# Patient Record
Sex: Female | Born: 1985 | Race: White | Hispanic: Yes | Marital: Single | Smoking: Never smoker
Health system: Southern US, Community
[De-identification: ages and names within clinical notes are randomized; demographics above are authoritative.]

## PROBLEM LIST (undated history)

## (undated) ENCOUNTER — Ambulatory Visit (HOSPITAL_COMMUNITY): Disposition: A | Discharge status: Home / Self Care | Payer: 59

## (undated) DIAGNOSIS — N915 Oligomenorrhea, unspecified: Secondary | ICD-10-CM

## (undated) DIAGNOSIS — E282 Polycystic ovarian syndrome: Secondary | ICD-10-CM

## (undated) DIAGNOSIS — Z789 Other specified health status: Secondary | ICD-10-CM

## (undated) DIAGNOSIS — E119 Type 2 diabetes mellitus without complications: Secondary | ICD-10-CM

## (undated) DIAGNOSIS — N970 Female infertility associated with anovulation: Secondary | ICD-10-CM

## (undated) HISTORY — DX: Polycystic ovarian syndrome: E28.2

## (undated) HISTORY — PX: NO PAST SURGERIES: SHX2092

## (undated) HISTORY — DX: Female infertility associated with anovulation: N97.0

## (undated) HISTORY — DX: Oligomenorrhea, unspecified: N91.5

## (undated) HISTORY — DX: Type 2 diabetes mellitus without complications: E11.9

---

## 2005-02-07 ENCOUNTER — Ambulatory Visit: Payer: Self-pay | Admitting: Family Medicine

## 2005-02-28 ENCOUNTER — Ambulatory Visit: Payer: Self-pay | Admitting: Family Medicine

## 2005-03-03 ENCOUNTER — Ambulatory Visit: Payer: Self-pay | Admitting: *Deleted

## 2005-03-03 ENCOUNTER — Ambulatory Visit (HOSPITAL_COMMUNITY): Admission: RE | Admit: 2005-03-03 | Discharge: 2005-03-03 | Payer: Self-pay | Admitting: Family Medicine

## 2005-04-11 ENCOUNTER — Ambulatory Visit: Payer: Self-pay | Admitting: Family Medicine

## 2005-05-16 ENCOUNTER — Ambulatory Visit: Payer: Self-pay | Admitting: Family Medicine

## 2005-05-19 ENCOUNTER — Ambulatory Visit (HOSPITAL_COMMUNITY): Admission: RE | Admit: 2005-05-19 | Discharge: 2005-05-19 | Payer: Self-pay | Admitting: Family Medicine

## 2005-06-13 ENCOUNTER — Inpatient Hospital Stay (HOSPITAL_COMMUNITY): Admission: AD | Admit: 2005-06-13 | Discharge: 2005-06-13 | Payer: Self-pay | Admitting: Family Medicine

## 2005-06-27 ENCOUNTER — Ambulatory Visit: Payer: Self-pay | Admitting: Family Medicine

## 2005-06-29 ENCOUNTER — Inpatient Hospital Stay (HOSPITAL_COMMUNITY): Admission: AD | Admit: 2005-06-29 | Discharge: 2005-06-29 | Payer: Self-pay | Admitting: Obstetrics & Gynecology

## 2005-07-01 ENCOUNTER — Emergency Department (HOSPITAL_COMMUNITY): Admission: EM | Admit: 2005-07-01 | Discharge: 2005-07-01 | Payer: Self-pay | Admitting: Family Medicine

## 2005-07-06 ENCOUNTER — Ambulatory Visit: Payer: Self-pay | Admitting: *Deleted

## 2005-09-13 ENCOUNTER — Ambulatory Visit: Payer: Self-pay | Admitting: Obstetrics and Gynecology

## 2006-07-23 ENCOUNTER — Emergency Department (HOSPITAL_COMMUNITY): Admission: EM | Admit: 2006-07-23 | Discharge: 2006-07-23 | Payer: Self-pay | Admitting: Family Medicine

## 2006-08-15 ENCOUNTER — Encounter (INDEPENDENT_AMBULATORY_CARE_PROVIDER_SITE_OTHER): Payer: Self-pay | Admitting: Family Medicine

## 2006-08-15 ENCOUNTER — Ambulatory Visit: Payer: Self-pay | Admitting: Sports Medicine

## 2006-08-15 DIAGNOSIS — E669 Obesity, unspecified: Secondary | ICD-10-CM | POA: Insufficient documentation

## 2006-08-15 LAB — CONVERTED CEMR LAB
CO2: 25 meq/L (ref 19–32)
Calcium: 9.3 mg/dL (ref 8.4–10.5)
Cholesterol: 125 mg/dL (ref 0–200)
Creatinine, Ser: 0.63 mg/dL (ref 0.40–1.20)
HDL: 35 mg/dL — ABNORMAL LOW (ref >39)
Pap Smear: NORMAL
Sodium: 141 meq/L (ref 135–145)
Total CHOL/HDL Ratio: 3.6

## 2006-08-21 ENCOUNTER — Encounter (INDEPENDENT_AMBULATORY_CARE_PROVIDER_SITE_OTHER): Payer: Self-pay | Admitting: Family Medicine

## 2006-09-13 ENCOUNTER — Ambulatory Visit: Payer: Self-pay | Admitting: Sports Medicine

## 2006-10-17 ENCOUNTER — Ambulatory Visit: Payer: Self-pay | Admitting: Family Medicine

## 2007-02-20 ENCOUNTER — Ambulatory Visit: Payer: Self-pay | Admitting: Internal Medicine

## 2007-02-26 ENCOUNTER — Encounter (INDEPENDENT_AMBULATORY_CARE_PROVIDER_SITE_OTHER): Payer: Self-pay | Admitting: Family Medicine

## 2007-02-26 ENCOUNTER — Ambulatory Visit: Payer: Self-pay | Admitting: Family Medicine

## 2007-02-26 DIAGNOSIS — IMO0002 Reserved for concepts with insufficient information to code with codable children: Secondary | ICD-10-CM | POA: Insufficient documentation

## 2007-02-26 LAB — CONVERTED CEMR LAB
Pap Smear: NORMAL
Whiff Test: NEGATIVE

## 2007-03-04 ENCOUNTER — Encounter (INDEPENDENT_AMBULATORY_CARE_PROVIDER_SITE_OTHER): Payer: Self-pay | Admitting: Family Medicine

## 2007-03-11 ENCOUNTER — Ambulatory Visit: Payer: Self-pay | Admitting: Family Medicine

## 2008-06-26 ENCOUNTER — Encounter (INDEPENDENT_AMBULATORY_CARE_PROVIDER_SITE_OTHER): Payer: Self-pay | Admitting: Family Medicine

## 2008-06-26 ENCOUNTER — Ambulatory Visit: Payer: Self-pay | Admitting: Family Medicine

## 2008-06-26 LAB — CONVERTED CEMR LAB
Beta hcg, urine, semiquantitative: NEGATIVE
Hgb A1c MFr Bld: 5.7 %
Whiff Test: NEGATIVE

## 2008-06-29 LAB — CONVERTED CEMR LAB
ALT: 17 units/L (ref 0–35)
AST: 16 units/L (ref 0–37)
Albumin: 4.6 g/dL (ref 3.5–5.2)
Alkaline Phosphatase: 109 units/L (ref 39–117)
BUN: 12 mg/dL (ref 6–23)
CO2: 26 meq/L (ref 19–32)
Calcium: 9.5 mg/dL (ref 8.4–10.5)
Chlamydia, DNA Probe: NEGATIVE
Chloride: 102 meq/L (ref 96–112)
Cholesterol: 124 mg/dL (ref 0–200)
Creatinine, Ser: 0.71 mg/dL (ref 0.40–1.20)
GC Probe Amp, Genital: NEGATIVE
Glucose, Bld: 90 mg/dL (ref 70–99)
HDL: 42 mg/dL (ref >39)
LDL Cholesterol: 64 mg/dL (ref 0–99)
Potassium: 4.3 meq/L (ref 3.5–5.3)
Sodium: 139 meq/L (ref 135–145)
TSH: 1.06 microintl units/mL (ref 0.350–4.500)
Total Bilirubin: 0.3 mg/dL (ref 0.3–1.2)
Total CHOL/HDL Ratio: 3
Total Protein: 8.2 g/dL (ref 6.0–8.3)
Triglycerides: 90 mg/dL (ref <150)
VLDL: 18 mg/dL (ref 0–40)

## 2008-08-03 ENCOUNTER — Ambulatory Visit: Payer: Self-pay | Admitting: Family Medicine

## 2008-08-03 DIAGNOSIS — L83 Acanthosis nigricans: Secondary | ICD-10-CM | POA: Insufficient documentation

## 2009-03-10 ENCOUNTER — Ambulatory Visit: Payer: Self-pay | Admitting: Family Medicine

## 2009-03-10 DIAGNOSIS — R6882 Decreased libido: Secondary | ICD-10-CM | POA: Insufficient documentation

## 2009-03-10 LAB — CONVERTED CEMR LAB: Beta hcg, urine, semiquantitative: NEGATIVE

## 2009-03-15 ENCOUNTER — Emergency Department (HOSPITAL_COMMUNITY): Admission: EM | Admit: 2009-03-15 | Discharge: 2009-03-15 | Payer: Self-pay | Admitting: Family Medicine

## 2009-03-16 ENCOUNTER — Ambulatory Visit: Payer: Self-pay | Admitting: Family Medicine

## 2009-03-16 DIAGNOSIS — R109 Unspecified abdominal pain: Secondary | ICD-10-CM | POA: Insufficient documentation

## 2009-03-19 ENCOUNTER — Ambulatory Visit (HOSPITAL_COMMUNITY): Admission: RE | Admit: 2009-03-19 | Discharge: 2009-03-19 | Payer: Self-pay | Admitting: Family Medicine

## 2009-03-24 ENCOUNTER — Telehealth: Payer: Self-pay | Admitting: Family Medicine

## 2009-03-25 ENCOUNTER — Ambulatory Visit: Payer: Self-pay | Admitting: Family Medicine

## 2009-03-25 DIAGNOSIS — R109 Unspecified abdominal pain: Secondary | ICD-10-CM | POA: Insufficient documentation

## 2009-03-25 LAB — CONVERTED CEMR LAB: Beta hcg, urine, semiquantitative: NEGATIVE

## 2009-03-31 ENCOUNTER — Ambulatory Visit (HOSPITAL_COMMUNITY): Admission: RE | Admit: 2009-03-31 | Discharge: 2009-03-31 | Payer: Self-pay | Admitting: Family Medicine

## 2009-04-01 ENCOUNTER — Telehealth: Payer: Self-pay | Admitting: *Deleted

## 2009-04-01 ENCOUNTER — Telehealth: Payer: Self-pay | Admitting: Family Medicine

## 2009-04-01 ENCOUNTER — Encounter: Payer: Self-pay | Admitting: Family Medicine

## 2009-04-20 ENCOUNTER — Telehealth: Payer: Self-pay | Admitting: Family Medicine

## 2009-04-22 ENCOUNTER — Telehealth: Payer: Self-pay | Admitting: Family Medicine

## 2009-08-10 ENCOUNTER — Ambulatory Visit: Payer: Self-pay | Admitting: Family Medicine

## 2009-08-10 ENCOUNTER — Encounter: Payer: Self-pay | Admitting: Family Medicine

## 2009-08-10 DIAGNOSIS — N912 Amenorrhea, unspecified: Secondary | ICD-10-CM | POA: Insufficient documentation

## 2009-08-10 LAB — CONVERTED CEMR LAB: Beta hcg, urine, semiquantitative: NEGATIVE

## 2009-08-11 LAB — CONVERTED CEMR LAB
ALT: 20 units/L (ref 0–35)
AST: 21 units/L (ref 0–37)
Albumin: 4.4 g/dL (ref 3.5–5.2)
Alkaline Phosphatase: 92 units/L (ref 39–117)
BUN: 10 mg/dL (ref 6–23)
CO2: 22 meq/L (ref 19–32)
Calcium: 8.8 mg/dL (ref 8.4–10.5)
Chloride: 105 meq/L (ref 96–112)
Creatinine, Ser: 0.59 mg/dL (ref 0.40–1.20)
FSH: 6.2 milliintl units/mL
Glucose, Bld: 109 mg/dL — ABNORMAL HIGH (ref 70–99)
Potassium: 4 meq/L (ref 3.5–5.3)
Prolactin: 13.5 ng/mL
Sodium: 140 meq/L (ref 135–145)
TSH: 1.164 microintl units/mL (ref 0.350–4.500)
Total Bilirubin: 0.2 mg/dL — ABNORMAL LOW (ref 0.3–1.2)
Total Protein: 7.5 g/dL (ref 6.0–8.3)

## 2009-08-12 ENCOUNTER — Telehealth: Payer: Self-pay | Admitting: Family Medicine

## 2009-08-20 ENCOUNTER — Telehealth: Payer: Self-pay | Admitting: Family Medicine

## 2009-08-21 ENCOUNTER — Encounter: Payer: Self-pay | Admitting: Family Medicine

## 2009-08-25 ENCOUNTER — Ambulatory Visit: Payer: Self-pay | Admitting: Gynecology

## 2009-08-25 ENCOUNTER — Encounter: Payer: Self-pay | Admitting: Family Medicine

## 2009-08-25 ENCOUNTER — Ambulatory Visit: Payer: Self-pay | Admitting: Family Medicine

## 2009-08-25 LAB — CONVERTED CEMR LAB
Chlamydia, DNA Probe: NEGATIVE
GC Probe Amp, Genital: NEGATIVE
Pap Smear: NEGATIVE

## 2009-08-30 ENCOUNTER — Encounter: Payer: Self-pay | Admitting: Family Medicine

## 2009-09-16 ENCOUNTER — Ambulatory Visit (HOSPITAL_COMMUNITY): Admission: RE | Admit: 2009-09-16 | Discharge: 2009-09-16 | Payer: Self-pay | Admitting: Gynecology

## 2009-10-13 ENCOUNTER — Ambulatory Visit: Payer: Self-pay | Admitting: Gynecology

## 2009-10-13 ENCOUNTER — Encounter: Payer: Self-pay | Admitting: Family Medicine

## 2009-11-10 ENCOUNTER — Ambulatory Visit: Payer: Self-pay | Admitting: Gynecology

## 2009-11-15 ENCOUNTER — Ambulatory Visit: Payer: Self-pay | Admitting: Gynecology

## 2009-12-02 ENCOUNTER — Ambulatory Visit: Payer: Self-pay | Admitting: Gynecology

## 2009-12-14 ENCOUNTER — Ambulatory Visit: Payer: Self-pay | Admitting: Family Medicine

## 2009-12-14 LAB — CONVERTED CEMR LAB: Beta hcg, urine, semiquantitative: NEGATIVE

## 2010-01-06 ENCOUNTER — Ambulatory Visit: Payer: Self-pay | Admitting: Gynecology

## 2010-01-26 ENCOUNTER — Ambulatory Visit: Payer: Self-pay | Admitting: Gynecology

## 2010-01-27 ENCOUNTER — Ambulatory Visit: Payer: Self-pay | Admitting: Family Medicine

## 2010-03-07 ENCOUNTER — Ambulatory Visit: Payer: Self-pay | Admitting: Gynecology

## 2010-03-29 ENCOUNTER — Encounter: Payer: Self-pay | Admitting: Family Medicine

## 2010-04-06 ENCOUNTER — Ambulatory Visit: Payer: Self-pay | Admitting: Gynecology

## 2010-04-27 ENCOUNTER — Ambulatory Visit
Admission: RE | Admit: 2010-04-27 | Discharge: 2010-04-27 | Payer: Self-pay | Source: Home / Self Care | Attending: Gynecology | Admitting: Gynecology

## 2010-05-15 ENCOUNTER — Encounter: Payer: Self-pay | Admitting: *Deleted

## 2010-05-18 ENCOUNTER — Ambulatory Visit
Admission: RE | Admit: 2010-05-18 | Discharge: 2010-05-18 | Payer: Self-pay | Source: Home / Self Care | Attending: Gynecology | Admitting: Gynecology

## 2010-05-24 NOTE — Assessment & Plan Note (Signed)
Summary: C/O SPOTS ON BREAST AND BODY/ARE CONCERNED/[facility]   Vital Signs:  Patient profile:   25 year old female Height:      65 inches Weight:      215.5 pounds BMI:     35.99 Temp:     98.9 degrees F oral Pulse rate:   87 / minute BP sitting:   120 / 85  (right arm) Cuff size:   large  Vitals Entered By: Jimmy Footman, CMA (January 27, 2010 2:38 PM) CC: Spots on breasrt and left side. noticed yesterday Is Patient Diabetic? No   Primary Provider:  Ellin Mayhew MD  CC:  Spots on breasrt and left side. noticed yesterday.  History of Present Illness: Pt came in because she noticed "spots" on her breast and body yesterday. Not painful, do not itch, has never had spots like this before. Has recently stopped Clomid and started Provera (yesterday) per fertility specialist b/c she has not yet gotten pregnant. Has been on Provera before with no skin changes. No other med changes. Has recently changed body lotion. Pt states that they are dark, not red, not raised, not irritating. Only noticed them because she happened to see them in the mirror, not because they were bothering her. No recent illness, fevers, sore throat, N/V. No other complaints.   Habits & Providers  Alcohol-Tobacco-Diet     Tobacco Status: never  Current Problems (verified): 1)  Fungal Dermatitis  (ICD-111.9) 2)  Cough  (ICD-786.2) 3)  Fertility Testing  (ICD-V26.21) 4)  Amenorrhea  (ICD-626.0) 5)  Abdominal Pain  (ICD-789.00) 6)  Inguinal Pain, Left  (ICD-789.09) 7)  Libido, Decreased  (ICD-799.81) 8)  Acanthosis Nigricans  (ICD-701.2) 9)  Obesity Nos  (ICD-278.00) 10)  Amenorrhea  (ICD-626.0) 11)  Dyspareunia  (ICD-625.0) 12)  Other Postsurgical Status Other  (ICD-V45.89) 13)  Caries, Dental, Unspecified  (ICD-521.00) 14)  Preventive Health Care  (ICD-V70.0) 15)  Screening For Malignant Neoplasm, Cervix  (ICD-V76.2) 16)  Examination, Routine Medical  (ICD-V70.0) 17)  Family History Diabetes 1st Degree  Relative  (ICD-V18.0) 18)  Family History of Cad Female 1st Degree Relative <50  (ICD-V17.3) 19)  Routine Gynecological Examination  (ICD-V72.31) 20)  Contraceptive Management  (ICD-V25.09) 21)  Contact or Exposure To Other Viral Diseases  (ICD-V01.79)  Current Medications (verified): 1)  Complete Natal Dha 29-1-200 & 250 Mg Misc (Prenat-Febis-Fepro-Fa-Ca-Omega) .Marland Kitchen.. 1 Tablet By Mouth Daily 2)  Metformin Hcl 500 Mg Tabs (Metformin Hcl) .Marland Kitchen.. 1 By Mouth Two Times A Day 3)  Clotrimazole 1 % Crea (Clotrimazole) .... Apply Cream Two Times A Day To Affected Area X10-14 Days  Allergies (verified): No Known Drug Allergies  Physical Exam  General:  alert, well-developed, and well-nourished.   Mouth:  no lesions in mouth.   Skin:  color normal, no ecchymoses, no petechiae, no purpura, and no edema.   Small, varying in size (0.5x0.5cm to 1x1cm), varying in shape (round, oval, asymmetric), hyperpigmented areas on left and right waist (just below rib cage) and under left breast. Dry, patchy, scaley, nonerythematous, hyperpigmented, flat, no central clearing.  Acanthosis nigricans on back of neck and in axilla.    Impression & Recommendations:  Problem # 1:  FUNGAL DERMATITIS (ICD-111.9)  Rash in small location, not consistent with drug reaction. Likely fungal vs inflammatory dermatitis. Scaley in nature, but not prurtic. Will treat as fungal infection x10 days. If not better, pt was instructed to come back, and steriod cream can be tried. No systemic symptoms.  Her updated medication list for this problem includes:    Clotrimazole 1 % Crea (Clotrimazole) .Marland Kitchen... Apply cream two times a day to affected area x10-14 days  Orders: [facility]- Est Level  3 ([address])  Complete Medication List: 1)  Complete Natal Dha 29-1-200 & 250 Mg Misc (Prenat-febis-fepro-fa-ca-omega) .Marland Kitchen.. 1 tablet by mouth daily 2)  Metformin Hcl 500 Mg Tabs (Metformin hcl) .Marland Kitchen.. 1 by mouth two times a day 3)  Clotrimazole 1 % Crea  (Clotrimazole) .... Apply cream two times a day to affected area x10-14 days  Patient Instructions: 1)  It looks like there you  have a little bit of a yeast (fungal) infection on your skin. Please put the medicine that I am giving you on the areas with the rash/ scaley, dry skin twice a day for 10 days. 2)  If this medicine does not help the rash in the next 2 weeks, or if it gets worse, come back so we can try a different medicine. 3)  I do not think this is related to your Provera, so keep taking all of your medicines. 4)  Sadly, there is also nothing that we have to treat the dark spots in your arm pits. However, you can use lotion to help keep that area from getting very dry. 5)  Good luck with getting pregnant! Prescriptions: CLOTRIMAZOLE 1 % CREA (CLOTRIMAZOLE) Apply cream two times a day to affected area x10-14 days  #1 large tube x 0   Entered and Authorized by:   Demetria Pore MD   Signed by:   Demetria Pore MD on 01/27/2010   Method used:   Electronically to        [facility] (retail)       [address]       [place], [place]  [address]       Ph: [phone]       Fax: [phone]   RxID:   [id number]

## 2010-05-24 NOTE — Assessment & Plan Note (Signed)
Summary: f/u UC/hernia,df   Vital Signs:  Patient profile:   25 year old female Height:      65 inches Weight:      217 pounds Temp:     98.1 degrees F Pulse rate:   82 / minute BP sitting:   110 / 72  (right arm)  Vitals Entered By: Theresia Lo RN (March 16, 2009 4:15 PM) CC: follow up from urgent care, ? diagnosed with inguinal hernia, left lower abdominal discomfort x 2 months, started out as pressure then developed into pain off and on now constant pain for past 4 days Is Patient Diabetic? No   Primary Care Provider:  Ellin Mayhew MD  CC:  follow up from urgent care, ? diagnosed with inguinal hernia, left lower abdominal discomfort x 2 months, and started out as pressure then developed into pain off and on now constant pain for past 4 days.  History of Present Illness: 25 y/o F:  1. Left Inguinal Pain: left inguinal pain x few months, started with and worsened by lifting heavy objects. she states that she went to the [facility] yesterday and was told that she may have a hernia and that she she should be referred to a Careers adviser. states that pain is 8/10 now. denies change in bowel movements, decreased appetite, N/V, dysuria, vaginal discharge.  Habits & Providers  Alcohol-Tobacco-Diet     Tobacco Status: never  Current Medications (verified): 1)  Complete Natal Dha 29-1-200 & 250 Mg Misc (Prenat-Febis-Fepro-Fa-Ca-Omega) .Marland Kitchen.. 1 Tablet By Mouth Daily 2)  Tri-Sprintec 0.18/0.215/0.25 Mg-35 Mcg Tabs (Norgestim-Eth Estrad Triphasic) .Marland Kitchen.. 1 Tab By Mouth At Same Time Everyday Disp: 3 Months/3 Packs  Allergies (verified): No Known Drug Allergies  Past History:  Past medical, surgical, family and social histories (including risk factors) reviewed for relevance to current acute and chronic problems.  Past Medical History: Obesity Hx of ovarian cyst - resolved on f/u US Has been seen by [facility] in 2007  Past Surgical History: Reviewed history from 06/26/2008 and no  changes required. None  Family History: Reviewed history from 06/26/2008 and no changes required. Family History of CAD Female 1st degree relative  at 25 yo Family History Diabetes 1st degree relative (father) Family History Hypertension ( father side) Family History of Stroke M ( father) 1st degree relative  at 50 yo Father - DM, HTN, CKD, HLD, CAD  Social History: Reviewed history from 06/26/2008 and no changes required. Lives with sister, 1 niece and 2 nephews.  Lived 1/2 of her life in the [place] and 1/2 in [place]. Bilingual . 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  She has a boyfriend for 2 years. Currently seeking pregnancy. No use of contraception.  Denies STD's , Tobacco, ETOH, drugs.   Review of Systems General:  Denies chills and fever. GI:  Denies bloody stools, change in bowel habits, constipation, dark tarry stools, diarrhea, loss of appetite, nausea, and vomiting. GU:  Denies abnormal vaginal bleeding, discharge, and dysuria.  Physical Exam  General:  obese, alert, does not appear to be in acute distress, moves around easily without evidence of pain. vitals reviewed. Abdomen:  soft, nontender, nondisteded.  no masses or rebound or guarding. no abdominal hernia, no inguinal hernia, no femoral hernia seen or palpated. Psych:  normally interactive, good eye contact, and slightly anxious.     Impression & Recommendations:  Problem # 1:  INGUINAL PAIN, LEFT (ICD-789.09) Assessment New No red flags. Exam not c/w hernia.  Patient with PMHx pelvic pain. She requests testing today. Will get Korea to R/O pelvic process causing pain. DDx: MSK pain as it did start after straining while lifting an object. Gave red flags and advised patient to make f/u with PCP to follow this issue.  Orders: Ultrasound (Ultrasound) [facility]- Est Level  3 ([address])  Problem # 2:  OBESITY NOS (ICD-278.00) Assessment: Unchanged  Orders: [facility]- Est Level  3 ([address])  Complete Medication  List: 1)  Complete Natal Dha 29-1-200 & 250 Mg Misc (Prenat-febis-fepro-fa-ca-omega) .Marland Kitchen.. 1 tablet by mouth daily 2)  Tri-sprintec 0.18/0.215/0.25 Mg-35 Mcg Tabs (Norgestim-eth estrad triphasic) .Marland Kitchen.. 1 tab by mouth at same time everyday disp: 3 months/3 packs  Patient Instructions: 1)  It was nice to meet you today. 2)  We wil schedule an ultrasound of your left side.   Vital Signs:  Patient profile:   25 year old female Height:      65 inches Weight:      217 pounds Temp:     98.1 degrees F Pulse rate:   82 / minute BP sitting:   110 / 72  (right arm)  Vitals Entered By: Theresia Lo RN (March 16, 2009 4:15 PM)   Prevention & Chronic Care Immunizations   Influenza vaccine: Fluvax 3+  (02/20/2007)   Influenza vaccine due: 02/20/2008    Tetanus booster: Not documented    Pneumococcal vaccine: Not documented  Other Screening   Pap smear: NEGATIVE FOR INTRAEPITHELIAL LESIONS OR MALIGNANCY.  (06/26/2008)   Pap smear due: 02/2008   Smoking status: never  (03/16/2009)

## 2010-05-24 NOTE — Letter (Signed)
Summary: Normal Pap-RTC 1 year  All     ,     Phone:   Fax:     Aug 30, 2009     [facility] [address]  [place], [place] [address]    Dear Ms. BARRAZA,  The results of your recent pap smear were: NORMAL. NO evidence of malignancy.  We will see you in 1 year for your annual exam.  Please contact our office if you have any questions.  Sincerely,   Milinda Antis MD   Appended Document: Normal Pap-RTC 1 year mailed.

## 2010-05-24 NOTE — Letter (Signed)
Summary: Generic Letter  [facility]  [address]   [place], [place] [address]   Phone: [phone]  Fax: [phone]    08/25/2009  Sydney Pierce [address] [place], [place]  [address]     Dear Sydney Pierce,    I have enclosed Sydney Pierce's recent lab-work, ultrasound results and PAP Smear.       Sincerely,   Milinda Antis MD

## 2010-05-24 NOTE — Assessment & Plan Note (Signed)
Summary: FU MENSTRUAL PROBLEMS/KH   Vital Signs:  Patient profile:   25 year old female Height:      65 inches Weight:      212.6 pounds BMI:     35.51 Temp:     98.0 degrees F oral Pulse rate:   95 / minute BP sitting:   120 / 75  (left arm) Cuff size:   regular  Vitals Entered By: Garen Grams LPN (August 10, 2009 1:54 PM)  CC: no period since feb. Is Patient Diabetic? No Pain Assessment Patient in pain? no        Primary Care Provider:  Ellin Mayhew MD  CC:  no period since feb.Marland Kitchen  History of Present Illness:  Pt presents for amenorrhea, since Sept 2010 did not have a menses, seen by physician and started on sprintec which she took Dec/ Jan/ Feb, during those months she had a 28-30 day cycle with regular menses lasting 3-5 days. Since feb no menses and she has been off the hormone contraception. Pt concerned about her amenorrhea as she has been trying to get pregnant for 4 years now. She had some initial work-up including an Korea and was seen by GYN once but not since then. Her boyfriend of 5 years has had an abnormal semen analysis in the past  Pt admits to daily stressors she is the only financial provider right now and she lives with her boyfriend She is also concerned with her weight and has been using Herbal life for past few weeks and doing a protein shake diet, where she drinks 2 shakes a day and has 1 meal exercies 2x a week by walking for 30 minutes  Habits & Providers  Alcohol-Tobacco-Diet     Tobacco Status: never  Allergies: No Known Drug Allergies  Past History:  Past Medical History: Last updated: 03/16/2009 Obesity Hx of ovarian cyst - resolved on f/u US Has been seen by [facility] in 2007  Family History: Reviewed history from 06/26/2008 and no changes required. Family History of CAD Female 1st degree relative  at 25 yo Family History Diabetes 1st degree relative (father) Family History Hypertension ( father side) Family History of  Stroke M ( father) 1st degree relative  at 42 yo Father - DM, HTN, CKD, HLD, CAD  Social History: Reviewed history from 06/26/2008 and no changes required. Lives with boyfriend.  Lived 1/2 of her life in the [place] and 1/2 in [place]. Bilingual . works at [employer]  Currently seeking pregnancy. No use of contraception.  Denies STD's , Tobacco, ETOH, drugs.   Physical Exam  General:  Well-developed,in no acute distress; alert,appropriate and cooperative throughout examination, obese Vital signs noted  Psych:  Oriented X3, memory intact for recent and remote, normally interactive, good eye contact, and not depressed appearing.     Impression & Recommendations:  Problem # 1:  AMENORRHEA (ICD-626.0) Assessment Unchanged I am concerned for PCOS pt with amenorrhea, normal Korea, ? hyperandrogenism will check initial infertility labs, start Metformin, refer to GYN clinic and send order for semen analysis. encouraged weight loss the proper way, not with fad dieting and stopped the herbal life, pt can continue MVI The following medications were removed from the medication list:    Tri-sprintec 0.18/0.215/0.25 Mg-35 Mcg Tabs (Norgestim-eth estrad triphasic) .Marland Kitchen... 1 tab by mouth at same time everyday disp: 3 months/3 packs  Orders: U Preg-[facility] ([id number]) Comp Met-[facility] [id number]) FSH-[facility] [id number]) TSH-[facility] [id number]) Prolactin-[facility] [phone]) Miscellaneous Lab Charge-[facility] [phone]) [facility]-  Est Level  3 ([address]) Obstetric Referral (Obstetric)  Problem # 2:  FERTILITY TESTING (ICD-V26.21) Assessment: New OB/GYN consult Orders: Miscellaneous Lab Charge-[facility] ([id number]) [facility]- Est Level  3 ([address]) Obstetric Referral (Obstetric)  Problem # 3:  OBESITY NOS (ICD-278.00) Assessment: Unchanged  Orders: [facility]- Est Level  3 ([address])  Complete Medication List: 1)  Complete Natal Dha 29-1-200 & 250 Mg Misc (Prenat-febis-fepro-fa-ca-omega) .Marland Kitchen.. 1 tablet by mouth daily 2)  Metformin Hcl 500 Mg Tabs  (Metformin hcl) .Marland Kitchen.. 1 by mouth two times a day with meals  Patient Instructions: 1)  Start taking the Metformin this may help with ovulation 2)  Chart your temperature the same time every morning this is called basal body temperatures and take to your visit 3)  You can pick up ovulation predictor kits as well 4)  I will refer you to GYN 5)  I will check some blood work today and call with results 6)  Please call [facility] to set up the semen analysis at [phone] 7)  Return to see myself or Dr. Edmonia James in 1 month 8)  Stop taking the herbal tea and cell activator 9)  Please do not drink more than 1 protein shake - try to make it your breakfast instead and eat a balanced diet Prescriptions: METFORMIN HCL 500 MG TABS (METFORMIN HCL) 1 by mouth two times a day with meals  #60 x 3   Entered and Authorized by:   Milinda Antis MD   Signed by:   Milinda Antis MD on 08/10/2009   Method used:   Electronically to        [facility] (retail)       [address]       [place], [place]  [address]       Ph: [phone]       Fax: [phone]   RxID:   [id number]   Laboratory Results   Urine Tests  Date/Time Received: August 10, 2009 1:59 PM  Date/Time Reported: 2:20 PM     Urine HCG: negative Comments: Eustaquio Boyden  MD  August 10, 2009 2:20 PM

## 2010-05-24 NOTE — Progress Notes (Signed)
  Phone Note Outgoing Call   Call placed by: Milinda Antis MD,  August 12, 2009 12:37 PM Details for Reason: Lab results, Metformin  Summary of Call: Pt given lab results, nothing I can find to explain a reversible cause of infertility at this stage, refer to OB/GYN Pt states she gets jitttery and anxious after taking the Metformin, may be dropping her Blood sugars too low. I told her to hold on the medication until seen by GYN Pt also noted that [facility] lab states they no longer do the semen analysis I will have to check on this

## 2010-05-24 NOTE — Miscellaneous (Signed)
Summary: DNKA at [facility]  Clinical Lists Changes rec'd fax that pt did not keep appt at [facility] 10/13/09.Golden Circle RN  October 13, 2009 4:23 PM

## 2010-05-24 NOTE — Progress Notes (Signed)
Summary: phn msg  Phone Note Call from Patient Call back at [facility] Phone [phone]   Caller: Patient Summary of Call: Pt's husband did a semanalazie and [employer] will be faxing results to Dr. Jeanice Lim.  The results need to go to the urologist Dr. Adriana Simas fax number is [phone] would like to know results before sending to Dr Adriana Simas - pls call pt. Initial call taken by: Clydell Hakim,  August 20, 2009 9:37 AM  Follow-up for Phone Call        message left on voicemail to call back with husband's name and if he is a patient  here. Follow-up by: Theresia Lo RN,  August 20, 2009 11:36 AM  Additional Follow-up for Phone Call Additional follow up Details #1::        Dr. Jeanice Lim ordered the semem analysis under patient 's name at patient's request . Dr. Jeanice Lim advises  to give report to wife to take to urologist office.  Additional Follow-up by: Theresia Lo RN,  August 20, 2009 12:12 PM

## 2010-05-24 NOTE — Assessment & Plan Note (Signed)
Summary: CPE/KH   Vital Signs:  Patient profile:   25 year old female Height:      65 inches Weight:      212 pounds BMI:     35.41 Temp:     98.6 degrees F oral Pulse rate:   77 / minute BP sitting:   110 / 73  (left arm) Cuff size:   large  Vitals Entered By: Tessie Fass CMA (Aug 25, 2009 4:19 PM) CC: complete physical with pap Is Patient Diabetic? No Pain Assessment Patient in pain? no        Primary Care Provider:  Ellin Mayhew MD  CC:  complete physical with pap.  History of Present Illness:   Pt here for CPE and PE No concerns Seen by OB/GYN for fertility work-up. Started on Vibromycin?? unable to read prescription, plans for HSG next week and possible intrauterine insemination No menses since cessation of OCP in Feb Reviewed previous infertility work-up   Pt had dental visit within past year  Will send results to Dr. Lily Peer at [facility]  Habits & Providers  Alcohol-Tobacco-Diet     Tobacco Status: never  Current Medications (verified): 1)  Complete Natal Dha 29-1-200 & 250 Mg Misc (Prenat-Febis-Fepro-Fa-Ca-Omega) .Marland Kitchen.. 1 Tablet By Mouth Daily  Allergies (verified): No Known Drug Allergies  Physical Exam  General:  Well-developed,in no acute distress; alert,appropriate and cooperative throughout examination, obese Vital signs noted  Eyes:  vision grossly intact, pupils equal, pupils round, and pupils reactive to light.  EOMI Ears:  R ear normal, L ear normal, and no external deformities.  TM clear bilat Mouth:  MMM. good dentition Neck:  Supple mild acanthosis nigricans 2-3 skin tags Breasts:  No mass, nodules, thickening, tenderness, bulging, retraction, inflamation, nipple discharge or skin changes noted.   Lungs:  Normal respiratory effort, chest expands symmetrically. Lungs are clear to auscultation, no crackles or wheezes. Heart:  RRR, no murmur Abdomen:  soft, non-tender, normal bowel sounds, and no distention.   Rectal:   no external abnormalities.   Genitalia:  Normal introitus for age, no external lesions, no vaginal discharge, mucosa pink and moist, no vaginal or cervical lesions, no vaginal atrophy, no friaility or hemorrhage, normal uterus size and position, unable to palpate ovaries Pulses:  2+ Neurologic:  No focal deficits   Impression & Recommendations:  Problem # 1:  EXAMINATION, ROUTINE MEDICAL (ICD-V70.0) Assessment New No red flags, discussed weight and exercise which I had previously discussed with patient. Pt to continue fertility work-up with GYN records to be faxed Orders: [facility] - Est  18-39 yrs [phone])  Problem # 2:  CONTACT OR EXPOSURE TO OTHER VIRAL DISEASES (ICD-V01.79) Assessment: New labs done Orders: GC/Chlamydia-[facility] ([id number]) HIV-[facility] [id number]) [facility] - Est  18-39 yrs ([id number])  Problem # 3:  SCREENING FOR MALIGNANT NEOPLASM, CERVIX (ICD-V76.2) Assessment: New  Orders: Pap Smear-[facility] ([id number]) [facility] - Est  18-39 yrs ([id number])  Complete Medication List: 1)  Complete Natal Dha 29-1-200 & 250 Mg Misc (Prenat-febis-fepro-fa-ca-omega) .Marland Kitchen.. 1 tablet by mouth daily  Patient Instructions: 1)  I will send you a letter with your PAP Smear results 2)  I will have my nurse fax over the labs to Dr. Lily Peer 3)  Continue with your exercise and watch your diet, make sure you have plenty of fruits and vegetables 4)  I reccommend and eye exam yearly and dental visit every 6 months if you are not doing so already

## 2010-05-24 NOTE — Assessment & Plan Note (Signed)
Summary: cough,df   Vital Signs:  Patient profile:   25 year old female Height:      65 inches Weight:      214 pounds BMI:     35.74 Temp:     98.7 degrees F oral Pulse rate:   71 / minute BP sitting:   109 / 74  (right arm) Cuff size:   large  Vitals Entered By: Tessie Fass CMA (December 14, 2009 11:37 AM) CC: cough x 10 days   Primary Care Provider:  Ellin Mayhew MD  CC:  cough x 10 days.  History of Present Illness: Sydney Pierce comes in today for complaint of coughing spells, worsening over the past 10 days.  Initially only at night when she would lay down to sleep, now occurring during the daytime as well.  More frequent after eating or drinking, are very forceful and cause her face to turn red with the force of the cough.  Does not feel ill; no nasal secretion/rhinorrhea, no fevers or chills, no sneezing.  Used sister's albuterol MDI and may have had mild improvement, but then had another coughing spell and saw blood in sputum.   Cough is spasmodic/paroxysmal.  Often has thin yellow or white sputum.   She has never been a smoker.   Is trying to become pregnant.  Under care of Dr. Reynaldo Minium, Gyn with Clomid treatment.  LMP 11/11/2009.  Pregnancy test negative today.   Current Medications (verified): 1)  Complete Natal Dha 29-1-200 & 250 Mg Misc (Prenat-Febis-Fepro-Fa-Ca-Omega) .Marland Kitchen.. 1 Tablet By Mouth Daily  Allergies (verified): No Known Drug Allergies  Family History: Family History of CAD Female 1st degree relative  at 25 yo Family History Diabetes 1st degree relative (father) Family History Hypertension ( father side) Family History of Stroke M ( father) 1st degree relative  at 39 yo Father - DM, HTN, CKD, HLD, CAD  Dec 14, 2009: Same as above, also with sister with asthma.  Physical Exam  General:  well appearing, NAD Eyes:  clear sclerae Ears:  TMs clear.  Nose:  pale nasal mucosa, without copious discharge. No sinus tenderness.  Mouth:  Clear oropharynx, no  exudates.  Moist mucus membranes.  Neck:  neck supple, no adenopathy Lungs:  Normal respiratory effort, chest expands symmetrically. Lungs are clear to auscultation, no crackles or wheezes. Heart:  Normal rate and regular rhythm. S1 and S2 normal without gallop, murmur, click, rub or other extra sounds. Abdomen:  Obese, soft and nontender.  Negative Murphy's sign.  No epigastric pain. No CVA tenderness.    Impression & Recommendations:  Problem # 1:  COUGH (ICD-786.2)  Cough which appears likely either allergic or possibly reflux.  Discussed the positional nature of the cough, and relation to eating/drinking.  She recalls having allergic cough in the past (early Spring) which clearedup wiht Allegra.  WIll try antihistamine (H1) first, then possibly H2 blocker if no relief. Encouraged her to come back if not better.  Discussed the fact that both H1 andH2 blockers are pregnancy category "B".  To discuss with her OB/Gyn at next visit.  Continue PNVs.   Orders: [facility]- Est Level  3 (99213)  Complete Medication List: 1)  Complete Natal Dha 29-1-200 & 250 Mg Misc (Prenat-febis-fepro-fa-ca-omega) .Marland Kitchen.. 1 tablet by mouth daily 2)  Metformin Hcl 500 Mg Tabs (Metformin hcl) .Marland Kitchen.. 1 by mouth two times a day  Other Orders: [facility] ([id number])  Patient Instructions: 1)  It was a pleasure to see you today.  I believe your cough may be due either to allergic symptoms, or reflux of stomach acid that triggers the coughing spells.  2)  I believe it is reasonable to start with an antihistamine (either Allegra 180mg , Claritin 10mg , or Zyrtec 10mg , one time daily) to see if this gets better.  3)  If you do not notice an improvement in the coughing spells, then I recommend you stop the above-mentioned med and start taking Ranitidine (Zantac) 150mg  twice daily.  ALL THE MEDICATIONS MENTIONED ARE SAFE FOR USE IN PREGNANCY, IN THE EVENT YOU BECOME PREGNANT WHILE TAKING THEM>  4)  Continue to take your prenatal vitamins  as prescribed by Dr. Lily Peer. 5)  Please follow up with Dr Jeanice Lim or Dr Edmonia James in the coming two to four weeks if not better, or if new symptoms present.   Laboratory Results   Urine Tests  Date/Time Received: December 14, 2009 11:46 AM  Date/Time Reported: December 14, 2009 11:49 AM     Urine HCG: negative Comments: ...........test performed by...........Marland KitchenTerese Door, CMA

## 2010-05-24 NOTE — Miscellaneous (Signed)
  Clinical Lists Changes  Problems: Removed problem of FUNGAL DERMATITIS (ICD-111.9) Removed problem of FERTILITY TESTING (ICD-V26.21) Removed problem of COUGH (ICD-786.2) Removed problem of AMENORRHEA (ICD-626.0) Removed problem of OTHER POSTSURGICAL STATUS OTHER (ICD-V45.89) Removed problem of CARIES, DENTAL, UNSPECIFIED (ICD-521.00) Removed problem of PREVENTIVE HEALTH CARE (ICD-V70.0) Removed problem of SCREENING FOR MALIGNANT NEOPLASM, CERVIX (ICD-V76.2) Removed problem of EXAMINATION, ROUTINE MEDICAL (ICD-V70.0) Removed problem of FAMILY HISTORY DIABETES 1ST DEGREE RELATIVE (ICD-V18.0) Removed problem of FAMILY HISTORY OF CAD FEMALE 1ST DEGREE RELATIVE <50 (ICD-V17.3) Removed problem of ROUTINE GYNECOLOGICAL EXAMINATION (ICD-V72.31) Removed problem of CONTRACEPTIVE MANAGEMENT (ICD-V25.09) Removed problem of CONTACT OR EXPOSURE TO OTHER VIRAL DISEASES (ICD-V01.79)      Allergies: No Known Drug Allergies

## 2010-05-25 ENCOUNTER — Other Ambulatory Visit: Payer: Self-pay

## 2010-05-31 ENCOUNTER — Encounter: Payer: Self-pay | Admitting: *Deleted

## 2010-06-06 ENCOUNTER — Other Ambulatory Visit: Payer: Self-pay

## 2010-06-06 ENCOUNTER — Ambulatory Visit: Payer: Self-pay | Admitting: Gynecology

## 2010-06-06 DIAGNOSIS — N8300 Follicular cyst of ovary, unspecified side: Secondary | ICD-10-CM

## 2010-06-08 ENCOUNTER — Ambulatory Visit (INDEPENDENT_AMBULATORY_CARE_PROVIDER_SITE_OTHER): Payer: Self-pay | Admitting: Gynecology

## 2010-06-08 DIAGNOSIS — N979 Female infertility, unspecified: Secondary | ICD-10-CM

## 2010-06-22 ENCOUNTER — Other Ambulatory Visit: Payer: Self-pay

## 2010-07-04 ENCOUNTER — Other Ambulatory Visit: Payer: Self-pay

## 2010-07-04 ENCOUNTER — Ambulatory Visit (INDEPENDENT_AMBULATORY_CARE_PROVIDER_SITE_OTHER): Payer: Self-pay | Admitting: Gynecology

## 2010-07-04 DIAGNOSIS — N8300 Follicular cyst of ovary, unspecified side: Secondary | ICD-10-CM

## 2010-07-05 ENCOUNTER — Other Ambulatory Visit: Payer: Self-pay

## 2010-07-05 ENCOUNTER — Ambulatory Visit: Payer: Self-pay | Admitting: Gynecology

## 2010-07-27 LAB — POCT URINALYSIS DIP (DEVICE)
Bilirubin Urine: NEGATIVE
Glucose, UA: NEGATIVE mg/dL
Ketones, ur: NEGATIVE mg/dL
Nitrite: NEGATIVE
Protein, ur: NEGATIVE mg/dL
Specific Gravity, Urine: 1.01 (ref 1.005–1.030)
Urobilinogen, UA: 0.2 mg/dL (ref 0.0–1.0)
pH: 6 (ref 5.0–8.0)

## 2010-07-27 LAB — POCT PREGNANCY, URINE: Preg Test, Ur: NEGATIVE

## 2010-08-08 ENCOUNTER — Ambulatory Visit: Payer: Self-pay | Admitting: Gynecology

## 2010-08-08 ENCOUNTER — Other Ambulatory Visit: Payer: Self-pay

## 2010-08-09 ENCOUNTER — Other Ambulatory Visit: Payer: Self-pay | Admitting: Gynecology

## 2010-08-10 ENCOUNTER — Other Ambulatory Visit: Payer: Self-pay

## 2010-08-10 ENCOUNTER — Ambulatory Visit (INDEPENDENT_AMBULATORY_CARE_PROVIDER_SITE_OTHER): Payer: Self-pay | Admitting: Gynecology

## 2010-08-10 DIAGNOSIS — N8300 Follicular cyst of ovary, unspecified side: Secondary | ICD-10-CM

## 2010-08-11 ENCOUNTER — Ambulatory Visit (INDEPENDENT_AMBULATORY_CARE_PROVIDER_SITE_OTHER): Payer: Self-pay | Admitting: Gynecology

## 2010-08-11 ENCOUNTER — Ambulatory Visit: Payer: Self-pay | Admitting: Gynecology

## 2010-08-11 DIAGNOSIS — N979 Female infertility, unspecified: Secondary | ICD-10-CM
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
## 2010-09-09 NOTE — Group Therapy Note (Signed)
NAMEPAMELA, INTRIERI NO.:  [id number]   MEDICAL RECORD NO.:  [id number]          PATIENT TYPE:  WOC   LOCATION:  [facility]                   FACILITY:  [facility]   PHYSICIAN:  Argentina Donovan, MD        DATE OF BIRTH:  [birth year]   DATE OF SERVICE:  09/13/2005                                    CLINIC NOTE   HISTORY OF PRESENT ILLNESS:  This patient is a 25 year old Hispanic English  speaking lady, gravida 0, who was in for an infertility consultation in  March of this year. At that point, Dr. Mayford Knife had ordered a thyroid screen  on her, which was normal. An ultrasound was normal with a simple corpus  luteum cyst. The patient is back to talk about infertility. She is a  slightly heavy lady, 200.6 pounds at 5 foot 5. Has been trying for over a  year to get pregnant. Has some irregular period but her cycle usually varies  from 30 to 40 days. She does not have a lot of hirsutism and there was no  sign of polycystic ovaries on the ultrasound and she is having periods. We  have discussed a workup starting with a semen analysis, which we are sending  her partner to. We will call her when we get the results. In addition to  that, I wanted her to get 2 months of basal body temperature charts. I have  instructed her in detail how to do this and supplied her with a chart. She  is to go to the pharmacy to obtain her thermometer. After 2 cycles, I am  going to see her and we discussed if the periods are still quite far apart,  we would do something to try to bring them together again. Also, we would  probably do a point coital test after that point, possibly  hysterosalpingogram and as a last result, a laparoscopy, unless she shows no  sign of ovulating and in that case, we would go a different route.   IMPRESSION:  Primary infertility.   PLAN:  Given to patient.           ______________________________  Argentina Donovan, MD     PR/MEDQ  D:  09/13/2005  T:  09/13/2005  Job:   [id number]

## 2010-10-03 ENCOUNTER — Ambulatory Visit (INDEPENDENT_AMBULATORY_CARE_PROVIDER_SITE_OTHER): Payer: Self-pay | Admitting: Family Medicine

## 2010-10-03 ENCOUNTER — Encounter: Payer: Self-pay | Admitting: Family Medicine

## 2010-10-03 ENCOUNTER — Other Ambulatory Visit (HOSPITAL_COMMUNITY)
Admission: RE | Admit: 2010-10-03 | Discharge: 2010-10-03 | Disposition: A | Discharge status: Home / Self Care | Payer: Self-pay | Source: Ambulatory Visit | Attending: Family Medicine | Admitting: Family Medicine

## 2010-10-03 DIAGNOSIS — M79609 Pain in unspecified limb: Secondary | ICD-10-CM

## 2010-10-03 DIAGNOSIS — M79606 Pain in leg, unspecified: Secondary | ICD-10-CM | POA: Insufficient documentation

## 2010-10-03 DIAGNOSIS — N912 Amenorrhea, unspecified: Secondary | ICD-10-CM

## 2010-10-03 DIAGNOSIS — Z124 Encounter for screening for malignant neoplasm of cervix: Secondary | ICD-10-CM

## 2010-10-03 DIAGNOSIS — M549 Dorsalgia, unspecified: Secondary | ICD-10-CM | POA: Insufficient documentation

## 2010-10-03 DIAGNOSIS — Z01419 Encounter for gynecological examination (general) (routine) without abnormal findings: Secondary | ICD-10-CM | POA: Insufficient documentation

## 2010-10-03 DIAGNOSIS — N970 Female infertility associated with anovulation: Secondary | ICD-10-CM

## 2010-10-03 LAB — POCT URINE PREGNANCY: Preg Test, Ur: NEGATIVE

## 2010-10-03 NOTE — Patient Instructions (Signed)
Back pain: Tylenol 650mg  by mouth twice per day as needed for back pain.  Try doing healthy back exercises. Leg pain: Since it is not frequent let just monitor for now.  Please return if new or worsening of symptoms.

## 2010-10-03 NOTE — Assessment & Plan Note (Signed)
No red flags on back exam. Most likely lumbar strain of paraspinal muscles related to activity and possibly worse due to pt's obese body habitus.  Pt to take tylenol prn.  Return if new or worsening of symptoms.  Encouraged weight loss.

## 2010-10-03 NOTE — Assessment & Plan Note (Signed)
Atypical presentation.  Very transient (last 1 min) then resolves. Occurs 2 x per week.  Etiology unclear. Pt to keep log book and monitor symptoms and return if any new or worsening of symptoms.  Tylenol prn.

## 2010-10-03 NOTE — Assessment & Plan Note (Addendum)
Dr Lily Peer following.  On clomid, dexamethasone po, metformin 500mg  daily, HCG shot.

## 2010-10-03 NOTE — Progress Notes (Signed)
  Subjective:    Patient ID: Sydney Pierce, female    DOB: 08/09/1985, 25 y.o.   MRN: [id number]  HPI Fertility- Seeing Dr. Lily Peer for artificial insemination.  Also taking clomid, dexamethasone po, hcg, and metformin 500mg  daily.  (unable to enter hcg injections and dexameth po into med list)  Sharp pain in legs- Upper leg pain, in front of leg only, occurs 2 x week, sometimes occurs at rest and sometimes with movement.  Lasts for 1 minute then resolves.  Eating good diet (3 fruits/veg day).  No joint redness. No joint swelling.    Back pain- Occurs when bending down, squatting, mopping, working in garden, lower back on both sides. Lasts a few minutes.  Improves after standing or rest.  1 x back pain lasted all afternoon.  No fever.  No problems with bowel or bladder.  No problems sleeping.   Pap smear- Would like to get up to date on pap smear today. No history of abnormal pap  Pmh, fh, sh, past surgeries, and medication: update, see appropriate tabs.  Review of Systems See above.    Objective:   Physical Exam  Constitutional: She is oriented to person, place, and time. She appears well-developed and well-nourished.  HENT:  Head: Normocephalic and atraumatic.  Cardiovascular: Normal rate and regular rhythm.  Exam reveals no gallop and no friction rub.   No murmur heard. Pulmonary/Chest: Effort normal and breath sounds normal. No respiratory distress. She has no wheezes. She has no rales.  Abdominal: Soft. She exhibits no distension. There is no tenderness. There is no rebound and no guarding.  Genitourinary: Vagina normal and uterus normal. There is no rash, tenderness, lesion or injury on the right labia. There is no rash, tenderness, lesion or injury on the left labia. Cervix exhibits no motion tenderness, no discharge and no friability. Right adnexum displays no mass, no tenderness and no fullness. Left adnexum displays no mass, no tenderness and no fullness.  Musculoskeletal:  Normal range of motion. She exhibits no edema.       + tenderness of low back paraspinal muscles,  Sensation grossly intact,  strength 5/5 in lower ext,  Reflexes present and equal bilateral in lower ext.   Neurological: She is alert and oriented to person, place, and time.  Skin: No rash noted.  Psychiatric: She has a normal mood and affect. Her behavior is normal. Judgment and thought content normal.   Pap smear obtained and sent to lab       Assessment & Plan:

## 2010-11-02 ENCOUNTER — Ambulatory Visit (INDEPENDENT_AMBULATORY_CARE_PROVIDER_SITE_OTHER): Payer: Self-pay | Admitting: Family Medicine

## 2010-11-02 VITALS — BP 123/77 | HR 87 | Temp 98.6°F | Ht 65.0 in | Wt 218.8 lb

## 2010-11-02 DIAGNOSIS — L659 Nonscarring hair loss, unspecified: Secondary | ICD-10-CM

## 2010-11-02 DIAGNOSIS — N912 Amenorrhea, unspecified: Secondary | ICD-10-CM

## 2010-11-02 DIAGNOSIS — N970 Female infertility associated with anovulation: Secondary | ICD-10-CM

## 2010-11-02 DIAGNOSIS — R6882 Decreased libido: Secondary | ICD-10-CM

## 2010-11-02 DIAGNOSIS — R21 Rash and other nonspecific skin eruption: Secondary | ICD-10-CM

## 2010-11-02 DIAGNOSIS — L83 Acanthosis nigricans: Secondary | ICD-10-CM

## 2010-11-02 LAB — POCT URINE PREGNANCY: Preg Test, Ur: NEGATIVE

## 2010-11-02 NOTE — Patient Instructions (Signed)
We will check your thyroid and blood glucose today.  Try antifungal cream 2 x day to area on chest x 2 weeks.  Return if no improvement. Return as needed- return for yearly physical.

## 2010-11-03 ENCOUNTER — Encounter: Payer: Self-pay | Admitting: Family Medicine

## 2010-11-03 DIAGNOSIS — L659 Nonscarring hair loss, unspecified: Secondary | ICD-10-CM | POA: Insufficient documentation

## 2010-11-03 NOTE — Assessment & Plan Note (Signed)
Most likely 2/2 hormonal changes from fertility treatments.  May continue to be an issue while receiving these treatments.  Pt interested in therapy for this issue. Gave a list of Spanish speaking therapists.

## 2010-11-03 NOTE — Assessment & Plan Note (Signed)
Obtained hgb a1c at today's appt. A1C- 5.8

## 2010-11-03 NOTE — Progress Notes (Signed)
  Subjective:    Patient ID: Sydney Pierce, female    DOB: 1986-01-11, 25 y.o.   MRN: [id number]  HPI 1) wants pregnancy test -undergoing fertility treatments with Dr. Lily Peer ( see medicine regimen in my last office note). Would like urine pregancy test.  LMP approx 1 month ago. No breast tenderness.  2) concern about losing hair -since starting fertility treatments has had hair loss all over, not losing in one particular place. Seems like a large amount.  TSH normal 1 year ago.  Would like this rechecked.  No dry scalp.   3) concern about decreased sexual desire -since starting fertility treatments, states that due to her weight she doesn't have some body image issues.  But did not have such a decrease in desire until after starting fertility treatments.    4) rash on chest- Dark patch of skin on mid chest. Did not respond to hydrocortisone cream.  Area dark in color.  Not improving.  No itching.  Pt states she doesn't like the way that it looks.     Review of Systems    as per above. Objective:   Physical Exam  Constitutional: She is oriented to person, place, and time. She appears well-developed and well-nourished.       obese  HENT:  Head: Normocephalic and atraumatic.       No visible patches of hairloss.  Unable to idenitify any areas of hair thinning.  Neck: Neck supple. No thyromegaly present.  Cardiovascular: Normal rate.  Exam reveals no gallop and no friction rub.   No murmur heard. Pulmonary/Chest: Effort normal and breath sounds normal. No respiratory distress.  Abdominal: Soft. There is no tenderness.  Musculoskeletal: She exhibits no edema.  Neurological: She is alert and oriented to person, place, and time.  Skin:       + acanthosis nigracans  On central chest at level of nipples, a hyperpigemented macule is present, irregular boards, appearance is similar to acanthosis nigracans but in abnormal location.  Psychiatric: She has a normal mood and affect. Her  behavior is normal. Judgment and thought content normal.          Assessment & Plan:

## 2010-11-03 NOTE — Assessment & Plan Note (Signed)
Pregnancy test negative- pt to follow up with dr. Lily Peer regarding fertility treatments

## 2010-11-03 NOTE — Assessment & Plan Note (Signed)
Will check TSH per pt request.  Most likely hair loss due to hormonal changes from fertility treatments.

## 2010-11-08 DIAGNOSIS — R21 Rash and other nonspecific skin eruption: Secondary | ICD-10-CM | POA: Insufficient documentation

## 2010-11-25 ENCOUNTER — Encounter: Payer: Self-pay | Admitting: Family Medicine

## 2010-11-25 ENCOUNTER — Ambulatory Visit (INDEPENDENT_AMBULATORY_CARE_PROVIDER_SITE_OTHER): Payer: Self-pay | Admitting: Family Medicine

## 2010-11-25 VITALS — BP 110/76 | HR 69 | Wt 216.7 lb

## 2010-11-25 DIAGNOSIS — N898 Other specified noninflammatory disorders of vagina: Secondary | ICD-10-CM

## 2010-11-25 DIAGNOSIS — N939 Abnormal uterine and vaginal bleeding, unspecified: Secondary | ICD-10-CM | POA: Insufficient documentation

## 2010-11-25 DIAGNOSIS — E669 Obesity, unspecified: Secondary | ICD-10-CM

## 2010-11-25 DIAGNOSIS — N926 Irregular menstruation, unspecified: Secondary | ICD-10-CM

## 2010-11-25 MED ORDER — PROGESTERONE MICRONIZED 200 MG PO CAPS: 200.0000 mg | ORAL_CAPSULE | Freq: Every day | ORAL | Status: DC

## 2010-11-25 NOTE — Progress Notes (Signed)
  Subjective:    Patient ID: Sydney Pierce, female    DOB: Sep 20, 1985, 25 y.o.   MRN: [id number]  HPI Patient here with complaint of vaginal spotting for the past 3 weeks (since July 13th).  Has been under the care of OB/GYN Dr. Lily Peer, for fertility treatments with hCG, Dexamethasone, Clomid; had completed cycle of artificial insemination in April, had spotting for 1 day in May and 1 day in June, not enough to require a pad on those months.  Was planning to use gonadotropin medications but not initiated yet.  On July 13 she began to have some light spotting, only saw on paper when she would clean herself (no pads).  This persisted until 7/28, when she began to use 1 pad/day for mildly increased amount of bleeding.  She has not used Clomid or the other treatments since April.  She continues to use Metformin 500mg  one time daily, says she could not tolerate higher doses due to stomach upset.   Has had hair loss in generalized distribution, was told by Dr. Lily Peer that she needed to see Endocrinology and was given names of references.    Review of Systems  Never been pregnant.  No fatigue or weakness, no change in weight, no red blood per rectum; no fever or chills; never been diagnosed with DM, although mother with thyroid disease, father (deceased) had DM and CAD. No siblings with DM.  She has had hair growth on face, no acne.  Hair loss continues unchanged.  No vaginal discharge, no dysuria, no polyuria, no hematuria.      Objective:   Physical Exam  Well appearing, obese female in no apparent distress HEENT Neck supple. Well groomed, with makeup.  No acne pustules seen.  Mild lanugo-like hair on cheeks.          Assessment & Plan:

## 2010-11-25 NOTE — Patient Instructions (Addendum)
It was a pleasure to see you today.    I sent a prescription for Prometrium 200mg , take 1 at bedtime for 12 days.  This should stop the vaginal bleeding.   I am ordering a 2 hour glucose tolerance test.  For this test you need to be fasting (nothing but water) for 8 hours before the test.  This can be scheduled with the lab.  LAB APPOINTMENT FOR FASTING GLUCOSE 2HR GTT, WITH DR CAVINESS AFTERWARD

## 2010-11-25 NOTE — Assessment & Plan Note (Signed)
Patient with mild anovulatory bleeding, who desires fertilityat this time and has been receiving treatment for this from OB.  My just-in-time research done at the time of the visit indicates that Prometrium is a safe choice to stop anovulatory bleeding in women who desire fertility. She indicates that Dr. Lily Peer had referred her to Endocrine, and she was not seeing Dr Lily Peer anymore.  I suspect she has had a full workup by him (including FSH, testosterone, prolactin, etc), so will not repeat this lab workup now.  TSH was normal at last visit.  Discussed this with her.  I suspect she has insulin resistance based on physical findings (obesity, some degree of virilization with hair growth; possibly with hair loss), anovulation.  This would meet NIH criteria for diagnosis of PCOS. Her pregnancy test today is negative. I recommend a 2hr GTT to evaluate for DM versus IFG/Insulin Resistance; she had a fasting glucose over 100 about 1 yr ago.  If her 2-hr glucose is between 140-199, this is an abnormal test and indicates insulin resistance.  I counseled her that she should see Endocrine if this is recommended by her fertility specialist; she is self-pay and asks if labs ordered by endocrine could be done here.  I have told her that it would be best if her Endocrinologist sends along progress notes, along with the labs requested, so that we may review before placing orders for labs.  Follow up with Dr Edmonia Eilene Voigt after 2hr GTT.

## 2010-11-30 ENCOUNTER — Other Ambulatory Visit: Payer: Self-pay

## 2010-11-30 DIAGNOSIS — E669 Obesity, unspecified: Secondary | ICD-10-CM

## 2010-11-30 NOTE — Progress Notes (Signed)
2 hr gtt done today [facility] [address] [place]

## 2010-12-01 ENCOUNTER — Encounter: Payer: Self-pay | Admitting: Family Medicine

## 2010-12-01 ENCOUNTER — Other Ambulatory Visit: Payer: Self-pay | Admitting: Family Medicine

## 2010-12-01 LAB — GLUCOSE TOLERANCE, 2 HOURS
Glucose, 2 hour: 126 mg/dL (ref 70–139)
Glucose, Fasting: 101 mg/dL — ABNORMAL HIGH (ref 70–99)

## 2010-12-02 ENCOUNTER — Telehealth: Payer: Self-pay | Admitting: Family Medicine

## 2010-12-02 LAB — GLUCOSE TOLERANCE, 2 HOURS W/ 1HR
Glucose, 1 hour: 210 mg/dL — ABNORMAL HIGH (ref 70–170)
Glucose, Fasting: 100 mg/dL — ABNORMAL HIGH (ref 70–99)

## 2010-12-02 NOTE — Telephone Encounter (Signed)
Called patient, to report the markedly elevated 1hr Glucola (210) which makes a diagnosis of DM.  I left a voice message for patient to call back .  I had sent a letter to her reporting the IFG on her initial result, which included only the fasting and 2hr readings.  The lab had added on the 1hr value yesterday, which came back after I had sent a results letter to patient reassuring her.   I left a message for her to call back to discuss; because I am away from the office next week, I request that her PCP (Dr. Edmonia Parker Sawatzky) speak with her in my absence about these results.

## 2010-12-06 ENCOUNTER — Encounter: Payer: Self-pay | Admitting: Family Medicine

## 2010-12-06 ENCOUNTER — Other Ambulatory Visit: Payer: Self-pay | Admitting: Family Medicine

## 2010-12-06 ENCOUNTER — Ambulatory Visit: Payer: Self-pay | Admitting: Family Medicine

## 2010-12-06 ENCOUNTER — Ambulatory Visit (INDEPENDENT_AMBULATORY_CARE_PROVIDER_SITE_OTHER): Payer: Self-pay | Admitting: Family Medicine

## 2010-12-06 VITALS — BP 113/74 | HR 89 | Wt 218.7 lb

## 2010-12-06 DIAGNOSIS — N898 Other specified noninflammatory disorders of vagina: Secondary | ICD-10-CM

## 2010-12-06 DIAGNOSIS — N939 Abnormal uterine and vaginal bleeding, unspecified: Secondary | ICD-10-CM

## 2010-12-06 DIAGNOSIS — M549 Dorsalgia, unspecified: Secondary | ICD-10-CM

## 2010-12-06 NOTE — Progress Notes (Signed)
  Subjective:    Patient ID: Sydney Pierce, female    DOB: Oct 20, 1985, 25 y.o.   MRN: [id number]  HPI Fall on saturday: [place] Seat down stairs of neighbors front porch- on sat. Slipped on wet steps.  Left upper arm pain/bruising. Right upper back pain. Left lower back and left hip pain/bruising since fall.  Back pain worse with movement.  Minimal improvement with motrin.  No fever. No problems with bowel or bladder.    Has had vaginal bleeding that is being worked up by fertility endocrinologist.  Seemed to have more bleeding after fall.   Review of Systems As per above    Objective:   Physical Exam  Musculoskeletal:       Left arm- Bruising over deltoid area.  Full rom.  Normal strength.  No point tenderness.   Back pain- Bruising present on left lower back.  + pain with palpation of lower back.  No radiation down leg.  Strength 5/5 in all 4 extremities.  Reflexes equal in upper and lower ext.  Sensation grossly intact.            Assessment & Plan:

## 2010-12-06 NOTE — Patient Instructions (Signed)
Take motrin 600mg  every 6 hours as needed for pain. Your back pain should slowly improve over the next 2 weeks. If new or worsening of symptoms please return for recheck.  I have not yet received the fax from your endocrinologist.  When I receive it I will have the office staff call you to set up a lab appointment.

## 2010-12-07 ENCOUNTER — Telehealth: Payer: Self-pay | Admitting: *Deleted

## 2010-12-07 NOTE — Telephone Encounter (Signed)
Patient called to follow up on her visit with Dr Elesa Hacker. He gave her Prometrium to bring on her since Provera didn't seem to work very well. He also stated that a pure gonadotropin cycle. She wants to do that with you. Is that ok? Do you need another consult or day 3 baseline [place] when she starts her period? PLs advise

## 2010-12-07 NOTE — Telephone Encounter (Signed)
Sydney Pierce, we will need a day 3 ultrasound here in the office and she will need to see me before hand so we can see what protocol would want to use and which gonadotropin perhaps that and Dr. August Saucer had recommended if she wants is to followup here in the office. Perhaps we can see her before her day 3 ultrasound so that we can order her medication and have a here available before the start of day 3.  (This message was sent to me by a Staff message so therefore I had to copy and paste it to the telephone encounter)   Patient was informed by a message to call and schedule a consult.

## 2010-12-12 ENCOUNTER — Other Ambulatory Visit: Payer: Self-pay

## 2010-12-13 ENCOUNTER — Encounter (HOSPITAL_COMMUNITY): Payer: Self-pay | Admitting: *Deleted

## 2010-12-13 ENCOUNTER — Inpatient Hospital Stay (HOSPITAL_COMMUNITY)
Admission: AD | Admit: 2010-12-13 | Discharge: 2010-12-14 | Disposition: A | Discharge status: Home / Self Care | Payer: Self-pay | Source: Ambulatory Visit | Attending: Family Medicine | Admitting: Family Medicine

## 2010-12-13 ENCOUNTER — Other Ambulatory Visit: Payer: Self-pay

## 2010-12-13 DIAGNOSIS — N939 Abnormal uterine and vaginal bleeding, unspecified: Secondary | ICD-10-CM

## 2010-12-13 DIAGNOSIS — N949 Unspecified condition associated with female genital organs and menstrual cycle: Secondary | ICD-10-CM | POA: Insufficient documentation

## 2010-12-13 DIAGNOSIS — N898 Other specified noninflammatory disorders of vagina: Secondary | ICD-10-CM

## 2010-12-13 DIAGNOSIS — N938 Other specified abnormal uterine and vaginal bleeding: Secondary | ICD-10-CM | POA: Insufficient documentation

## 2010-12-13 HISTORY — DX: Other specified health status: Z78.9

## 2010-12-13 LAB — CBC
Hemoglobin: 10.6 g/dL — ABNORMAL LOW (ref 12.0–15.0)
Platelets: 227 10*3/uL (ref 150–400)
Platelets: 223 10*3/uL (ref 150–400)
RBC: 3.94 MIL/uL (ref 3.87–5.11)
RBC: 3.8 MIL/uL — ABNORMAL LOW (ref 3.87–5.11)
RDW: 13.7 % (ref 11.5–15.5)
WBC: 6.6 10*3/uL (ref 4.0–10.5)

## 2010-12-13 LAB — PROLACTIN: Prolactin: 14.3 ng/mL

## 2010-12-13 LAB — POCT PREGNANCY, URINE: Preg Test, Ur: NEGATIVE

## 2010-12-13 NOTE — Progress Notes (Signed)
Prolactin and cbc done today [facility] Alycia [place]

## 2010-12-13 NOTE — Progress Notes (Signed)
Pt reports "I am here because i have been having a period since 07/13." states it became heavy 3 weeks ago. States she is now changing a pad every 2-3 hours. Prior to this bleeding her last period was in April and she had IUI  Done at the end of April, did not have a positive preg test but did not have a period in April or May. No pain right now , but has had back pain off/on since bleeding started

## 2010-12-13 NOTE — Progress Notes (Signed)
Last normal period was in April, IUI also in April.  May and June only had spotting for 1 day.  July started spotting on the 13th through the 27, then had mild period since.  On Sat aug 11,after she fell then period was heavy.  And had a large clot the size of the palm of her hand, since then a lot of clotting.  Heavier now.

## 2010-12-13 NOTE — Assessment & Plan Note (Addendum)
Pt requests lab work as ordered by her reproductive endocrinologist.  Cheaper through our office.   Endo to fax lab requests to our office.  Increased vaginal bleeding post fall most likely incidental.  abd exam wnl on today's exam.  althought ob/gyn and fertility endocrinologist are managing this issue. Will recheck cbc since pt is concerned.

## 2010-12-13 NOTE — Assessment & Plan Note (Addendum)
No red flags on physical exam.  Injury/pain is consistent with mechanism of fall.  Pt to take motrin for pain relief x 2 weeks.  Return if no improvement within next 2 weeks or if new or worsening of symptoms.

## 2010-12-14 MED ORDER — MEDROXYPROGESTERONE ACETATE 10 MG PO TABS: 10.0000 mg | ORAL_TABLET | Freq: Every day | ORAL | Status: DC

## 2010-12-14 NOTE — ED Provider Notes (Signed)
History   Pt presents today c/o vag bleeding for the past 1.5 months. She has a long hx of irregular menses and infertility. She is no longer seeing Dr. Lily Peer because she owes money. She does have a PCP and has f/u scheduled. She recently had a CBC and prolactin drawn earlier today. She denies abd pain or any other problems at this time. She states her bleeding has been heavy and comes and goes. She denies fever or any other sx at this time.  Chief Complaint  Patient presents with  . Vaginal Bleeding   HPI  OB History    Grav Para Term Preterm Abortions TAB SAB Ect Mult Living   0               Past Medical History  Diagnosis Date  . Infertility associated with anovulation   . PCOS (polycystic ovarian syndrome)   . Oligomenorrhea   . No pertinent past medical history     Past Surgical History  Procedure Date  . No past surgeries     Family History  Problem Relation Age of Onset  . Diabetes Father   . Hyperlipidemia Father   . Heart disease Father   . Hypertension Father   . Kidney disease Father     History  Substance Use Topics  . Smoking status: Never Smoker   . Smokeless tobacco: Not on file  . Alcohol Use: Yes     4 beers/year    Allergies: No Known Allergies  Prescriptions prior to admission  Medication Sig Dispense Refill  . EVENING PRIMROSE OIL PO Take 1 tablet by mouth daily.        . metFORMIN (GLUCOPHAGE) 500 MG tablet Take 500 mg by mouth 2 (two) times daily with a meal.        . Prenat-FeBis-FePro-FA-CA-Omega (COMPLETE NATAL DHA) 29-1-200 & 250 MG MISC Take 1 tablet by mouth daily.        . progesterone (PROMETRIUM) 200 MG capsule Take 1 capsule (200 mg total) by mouth at bedtime.  12 capsule  0    Review of Systems  Constitutional: Negative for fever.  Cardiovascular: Negative for chest pain.  Gastrointestinal: Negative for nausea, vomiting, abdominal pain, diarrhea and constipation.  Genitourinary: Negative for dysuria, urgency, frequency  and hematuria.  Neurological: Negative for dizziness and headaches.  Psychiatric/Behavioral: Negative for depression and suicidal ideas.   Physical Exam   Blood pressure 119/77, pulse 93, temperature 98.4 F (36.9 C), temperature source Oral, resp. rate 18, height 5\' 5"  (1.651 m), weight 221 lb (100.245 kg), last menstrual period 11/04/2010.  Physical Exam  Constitutional: She is oriented to person, place, and time. She appears well-developed and well-nourished. No distress.  HENT:  Head: Normocephalic and atraumatic.  Eyes: EOM are normal. Pupils are equal, round, and reactive to light.  GI: Soft. She exhibits no distension. There is no tenderness. There is no rebound and no guarding.  Genitourinary: There is bleeding around the vagina. No vaginal discharge found.  Neurological: She is alert and oriented to person, place, and time.  Skin: Skin is warm and dry. She is not diaphoretic.  Psychiatric: She has a normal mood and affect. Her behavior is normal. Judgment and thought content normal.    MAU Course  Procedures  Results for orders placed during the hospital encounter of 12/13/10 (from the past 24 hour(s))  CBC     Status: Abnormal   Collection Time   12/13/10 10:50 PM  Component Value Range   WBC 8.6  4.0 - 10.5 (K/uL)   RBC 3.80 (*) 3.87 - 5.11 (MIL/uL)   Hemoglobin 10.6 (*) 12.0 - 15.0 (g/dL)   HCT 16.1 (*) 09.6 - 46.0 (%)   MCV 86.6  78.0 - 100.0 (fL)   MCH 27.9  26.0 - 34.0 (pg)   MCHC 32.2  30.0 - 36.0 (g/dL)   RDW 04.5  40.9 - 81.1 (%)   Platelets 223  150 - 400 (K/uL)  POCT PREGNANCY, URINE     Status: Normal   Collection Time   12/13/10 11:11 PM      Component Value Range   Preg Test, Ur NEGATIVE       Assessment and Plan  DUB: discussed with pt at length. She will f/u with her PCP. Will give Rx for provera. Discussed diet, activity, risks, and precautions.  Clinton Gallant. Leyton Brownlee III, DrHSc, MPAS, PA-C  12/14/2010, 12:01 AM

## 2010-12-15 ENCOUNTER — Encounter: Payer: Self-pay | Admitting: Gynecology

## 2010-12-15 ENCOUNTER — Ambulatory Visit (INDEPENDENT_AMBULATORY_CARE_PROVIDER_SITE_OTHER): Payer: Self-pay | Admitting: Gynecology

## 2010-12-15 VITALS — BP 126/84

## 2010-12-15 DIAGNOSIS — N970 Female infertility associated with anovulation: Secondary | ICD-10-CM

## 2010-12-15 DIAGNOSIS — N469 Male infertility, unspecified: Secondary | ICD-10-CM

## 2010-12-15 NOTE — Progress Notes (Signed)
Patient's is a 25 year old with history of primary infertility. Patient is a history of oligomenorrhea and possible PCO S. Patient with history regular cycles.  Infertility workup: Normal HSG in 2012 Normal semen analysis 2007 (high viscosity increase abnormal forms) Normal hemoglobin A1c  Infertility treatment: Clomiphene citrate from day 5 through 9 maximum dose 200 mg serum progesterone level good response at the highest dose of clomiphene citrate 100 mg per day 5 through 9. (Serum progesterone level on day 21 was 36.1) sperm washing and intrauterine insemination were utilized with no success and pregnancy patient did respond after administration of dexamethasone from days to through 18. She also been placed on metformin 500 mg 3 times a day as well without success.  Patient had been referred to Dr. Morrison Old reproductive endocrinologist who had recommended the patient have a prolactin and AMH be drawn here in our office. He had offered her gonadotropins and IUI treatment versus IVF/ICSI or for Femara/IUI. She has decided to proceed with gonadotropin treatment here in our office.  Patient was seen in the emergency room this week because of heavy period and had been prescribed Provera 10 mg for 10 days. She will wait until start of her next period to begin the following treatment. Patient was counseled as to the risks benefits and pros and cons of ovulation induction medication with the  Risk of  multiple births and hyperstimulation syndrome.  Patient will wait to the start of her next menstrual cycle and come to the office on day 3 for baseline ultrasound, we will start her on gonadotropin (Pergonal 150 units) day 3 today 12. She'll come to the office on day 12 for an ultrasound for follicle study and determination of triggering with hCG 10,000 units with followup sperm washing and IUI 24 hours after injection. When she comes to the office for baseline ultrasound will need to  check her  prolactin level and AMH blood work. Patient to continue to take her prenatal vitamins. All of  the above was discussed in Spanish, all questions were answered and we'll follow accordingly. Patient is to continue on her metformin 500 mg 3 times a day as previously prescribed.

## 2010-12-19 NOTE — ED Provider Notes (Signed)
Chart reviewed and agree with management and plan.  

## 2010-12-23 ENCOUNTER — Telehealth: Payer: Self-pay | Admitting: *Deleted

## 2010-12-23 NOTE — Telephone Encounter (Signed)
Patient called to say she finished Provera RX and started spotting yesterday but does not have a flow.  She will monitor for now and let us know an update next week.

## 2010-12-30 ENCOUNTER — Inpatient Hospital Stay (HOSPITAL_COMMUNITY): Payer: Self-pay

## 2010-12-30 ENCOUNTER — Inpatient Hospital Stay (HOSPITAL_COMMUNITY)
Admission: AD | Admit: 2010-12-30 | Discharge: 2010-12-30 | Disposition: A | Discharge status: Home / Self Care | Payer: Self-pay | Source: Ambulatory Visit | Attending: Family Medicine | Admitting: Family Medicine

## 2010-12-30 ENCOUNTER — Encounter (HOSPITAL_COMMUNITY): Payer: Self-pay | Admitting: *Deleted

## 2010-12-30 DIAGNOSIS — E669 Obesity, unspecified: Secondary | ICD-10-CM | POA: Insufficient documentation

## 2010-12-30 DIAGNOSIS — L83 Acanthosis nigricans: Secondary | ICD-10-CM | POA: Insufficient documentation

## 2010-12-30 DIAGNOSIS — N939 Abnormal uterine and vaginal bleeding, unspecified: Secondary | ICD-10-CM | POA: Insufficient documentation

## 2010-12-30 DIAGNOSIS — D649 Anemia, unspecified: Secondary | ICD-10-CM | POA: Insufficient documentation

## 2010-12-30 DIAGNOSIS — IMO0002 Reserved for concepts with insufficient information to code with codable children: Secondary | ICD-10-CM | POA: Insufficient documentation

## 2010-12-30 DIAGNOSIS — E282 Polycystic ovarian syndrome: Secondary | ICD-10-CM | POA: Insufficient documentation

## 2010-12-30 DIAGNOSIS — N970 Female infertility associated with anovulation: Secondary | ICD-10-CM | POA: Insufficient documentation

## 2010-12-30 DIAGNOSIS — N926 Irregular menstruation, unspecified: Secondary | ICD-10-CM | POA: Insufficient documentation

## 2010-12-30 LAB — CBC
Hemoglobin: 10.7 g/dL — ABNORMAL LOW (ref 12.0–15.0)
Platelets: 229 10*3/uL (ref 150–400)
RBC: 3.92 MIL/uL (ref 3.87–5.11)
WBC: 8.3 10*3/uL (ref 4.0–10.5)

## 2010-12-30 LAB — POCT PREGNANCY, URINE: Preg Test, Ur: NEGATIVE

## 2010-12-30 MED ORDER — FERROUS SULFATE 325 (65 FE) MG PO TABS: 325.0000 mg | ORAL_TABLET | Freq: Every day | ORAL | Status: DC

## 2010-12-30 MED ORDER — MEDROXYPROGESTERONE ACETATE 10 MG PO TABS: 10.0000 mg | ORAL_TABLET | Freq: Every day | ORAL | Status: DC

## 2010-12-30 NOTE — ED Provider Notes (Signed)
Chief Complaint:  Vaginal Bleeding   Sydney Pierce is  25 y.o. G0P0.  Patient's last menstrual period was 11/04/2010.Marland Kitchen  Her pregnancy status is negative.  She presents complaining of Vaginal Bleeding . Onset is described as insidious and has been present for  24 hours, worsening over the last 2 hours. Reports changing pad q2-3 hours since yesterday then for the 2 hours prior to arrival in MAU saturating pad q 15 mins and passing large clots. Denies palpations, dizziness, nausea, or vomiting  Obstetrical/Gynecological History: OB History    Grav Para Term Preterm Abortions TAB SAB Ect Mult Living   0               Past Medical History: Past Medical History  Diagnosis Date  . Infertility associated with anovulation   . PCOS (polycystic ovarian syndrome)   . Oligomenorrhea   . No pertinent past medical history   . Infertility associated with anovulation     Past Surgical History: Past Surgical History  Procedure Date  . No past surgeries     Family History: Family History  Problem Relation Age of Onset  . Diabetes Father   . Hyperlipidemia Father   . Heart disease Father   . Hypertension Father   . Kidney disease Father     Social History: History  Substance Use Topics  . Smoking status: Never Smoker   . Smokeless tobacco: Not on file  . Alcohol Use: Yes     4 beers/year    Allergies: No Known Allergies  Prescriptions prior to admission  Medication Sig Dispense Refill  . BIOTIN 5000 PO Take 1 capsule by mouth daily.        Marland Kitchen EVENING PRIMROSE OIL PO Take 2 tablets by mouth daily.       . metFORMIN (GLUCOPHAGE) 500 MG tablet Take 500 mg by mouth daily.       . Prenat-FeBis-FePro-FA-CA-Omega (COMPLETE NATAL DHA) 29-1-200 & 250 MG MISC Take 1 tablet by mouth daily.       Marland Kitchen DISCONTD: medroxyPROGESTERone (PROVERA) 10 MG tablet Take 1 tablet (10 mg total) by mouth daily.  10 tablet  0  . DISCONTD: progesterone (PROMETRIUM) 200 MG capsule Take 1 capsule (200 mg total)  by mouth at bedtime.  12 capsule  0    Review of Systems - Negative except what is noted in HPI  Physical Exam   Blood pressure 114/70, pulse 91, temperature 97.8 F (36.6 C), temperature source Oral, resp. rate 18, height 5\' 5"  (1.651 m), weight 97.977 kg (216 lb), last menstrual period 11/04/2010.  General: General appearance - alert, well appearing, and in no distress and obese Mental status - alert, oriented to person, place, and time, normal mood, behavior, speech, dress, motor activity, and thought processes, affect appropriate to mood Abdomen - soft, nontender, nondistended, no masses or organomegaly, obese Focused Gynecological Exam: VULVA: normal appearing vulva with no masses, tenderness or lesions, blood noted on ext genitalia with small adherent clots, VAGINA: moderate amount of blood cleared from vault, no clots, CERVIX: NL appearing cervix with small amount of menstrual like bleeding noted from os, UTERUS: exam limited by pt habitus, nontender, ADNEXA: nontender, exam limited by habitus  Labs: Recent Results (from the past 24 hour(s))  CBC   Collection Time   12/30/10  7:50 PM      Component Value Range   WBC 8.3  4.0 - 10.5 (K/uL)   RBC 3.92  3.87 - 5.11 (MIL/uL)   Hemoglobin 10.7 (*)  12.0 - 15.0 (g/dL)   HCT 40.9 (*) 81.1 - 46.0 (%)   MCV 86.5  78.0 - 100.0 (fL)   MCH 27.3  26.0 - 34.0 (pg)   MCHC 31.6  30.0 - 36.0 (g/dL)   RDW 91.4  78.2 - 95.6 (%)   Platelets 229  150 - 400 (K/uL)  POCT PREGNANCY, URINE   Collection Time   12/30/10  9:19 PM      Component Value Range   Preg Test, Ur NEGATIVE     Imaging Studies:  RADIOLOGY REPORT*  Clinical Data: Abnormal bleeding. Back pain.  TRANSABDOMINAL AND TRANSVAGINAL ULTRASOUND OF PELVIS  Technique: Both transabdominal and transvaginal ultrasound  examinations of the pelvis were performed. Transabdominal technique  was performed for global imaging of the pelvis including uterus,  ovaries, adnexal regions, and pelvic  cul-de-sac.  Comparison: 03/19/2009 [facility].  It was necessary to proceed with endovaginal exam following the  transabdominal exam to visualize the uterus, endometrium and  ovaries.  Findings:  Uterus: 6.7 x 3.1 x 4.1 cm. No focal mass.  Endometrium: 6.6 mm.  Right ovary: 2.6 cm minimally complex cyst right ovary.  Left ovary: Not visualized.  Other findings: Small amount of free fluid.  IMPRESSION:  Small amount of free fluid.  Left ovary not visualized.  Cause of the patient's bleeding not identified.  Original Report Authenticated By: Fuller Canada, M.D.   Assessment: Abnormal Uterine Bleeding, Anemia (Mild)  Patient Active Problem List  Diagnoses  . OBESITY NOS  . DYSPAREUNIA  . AMENORRHEA  . ACANTHOSIS NIGRICANS  . ABDOMINAL PAIN  . INGUINAL PAIN, LEFT  . LIBIDO, DECREASED  . Infertility, anovulation  . Back pain  . Leg pain, anterior  . Hair loss  . Rash  . Abnormal vaginal bleeding    Plan: Discharge home Rx Provera 10mg  po daily x 15 days Rx Iron supplement daily FU with Dr. Lily Peer, call office on Monday to schedule appt.  Sydney Pierce E. 12/30/2010,10:58 PM

## 2010-12-30 NOTE — ED Notes (Signed)
Pt has been treated recently for infertility by Dr Edmonia James and states that she has not been ovulating; states that she has never been pregnant;

## 2010-12-30 NOTE — Progress Notes (Signed)
Pt did not have a period in  May & June; But then on  July 13th, pt started intermittent, light spotting for 2 weeks;  Then bleeding started on July 27th and lasted about a month; pt was seen in MAu  For the bleeding and she was prescribed 10 days of Provera; the bleeding stopped with the Provera for 5 days then she started spotting when she had sex; On 12-27-10, pt started bleeding again and the bleeding has progressively gotten heavy with large clots;

## 2010-12-31 NOTE — ED Provider Notes (Signed)
Chart reviewed and agree with management and plan.  

## 2011-02-01 ENCOUNTER — Telehealth: Payer: Self-pay | Admitting: *Deleted

## 2011-02-01 MED ORDER — MEDROXYPROGESTERONE ACETATE 10 MG PO TABS: 10.0000 mg | ORAL_TABLET | Freq: Every day | ORAL | Status: DC

## 2011-02-01 NOTE — Telephone Encounter (Signed)
Pt called had her last period Sept 2, no period yet this month. Last period brought on by provera. She is going to check a upt at home and she is requesting another RX for Provera is that ok? Thanks

## 2011-02-01 NOTE — Telephone Encounter (Signed)
Informed and pt is going to do a pure gonadotropin cycle not Clomid as discussed. Rx to pharmacy. She will check UPT before taking in one week

## 2011-02-01 NOTE — Telephone Encounter (Signed)
The patient will be informed to make sure that she does a home pregnancy test that is negative before she starts to Provera 10 mg for 5-10 days to initiate her period. I would recommend that she wait a week to see if her cycle starts if not do a urine pregnancy test and negative then go ahead and start the Provera and instructed All as previously prescribed with a clomiphene citrate the day 5 through 9. Grossly

## 2011-02-28 ENCOUNTER — Telehealth: Payer: Self-pay | Admitting: *Deleted

## 2011-02-28 NOTE — Telephone Encounter (Signed)
Pt called to say she started her period on 02/26/11.  She wants to wait til Feb/March 2013 before doing cycle and will call in 60 days if no period. KW

## 2011-03-09 ENCOUNTER — Ambulatory Visit: Payer: Self-pay | Admitting: Family Medicine

## 2011-05-01 ENCOUNTER — Encounter: Payer: Self-pay | Admitting: Family Medicine

## 2011-05-01 ENCOUNTER — Ambulatory Visit (INDEPENDENT_AMBULATORY_CARE_PROVIDER_SITE_OTHER): Payer: Self-pay | Admitting: Family Medicine

## 2011-05-01 DIAGNOSIS — R21 Rash and other nonspecific skin eruption: Secondary | ICD-10-CM

## 2011-05-01 DIAGNOSIS — E669 Obesity, unspecified: Secondary | ICD-10-CM

## 2011-05-01 MED ORDER — CLOTRIMAZOLE-BETAMETHASONE 1-0.05 % EX CREA: TOPICAL_CREAM | Freq: Two times a day (BID) | CUTANEOUS | Status: DC

## 2011-05-01 NOTE — Progress Notes (Signed)
  Subjective:    Patient ID: Sydney Pierce, female    DOB: 1986-01-05, 26 y.o.   MRN: [id number]  HPI Rash: Patient reports continued rash in the mid chest area. States that she was seen for this approximately 6 months ago. Was prescribed antifungal cream. This helped but it did not go completely away. Now rash thicker/scaley/continued dark-hyperpigmented color.-no itching. No drainage. Also rash seems to be spreading underneath medial breast area. No fever. No other rash.   Review of Systems As per above.    Objective:   Physical Exam  Constitutional: She is oriented to person, place, and time. She appears well-developed and well-nourished.  HENT:  Head: Normocephalic and atraumatic.  Cardiovascular: Normal rate, regular rhythm and normal heart sounds.   Pulmonary/Chest: Effort normal. No respiratory distress. She has no wheezes.  Neurological: She is alert and oriented to person, place, and time.  Skin:       Hyperpigmented, scaley rash located mid chest between breast, some extension of dryness/hyperpigementation under medial breast area. No pain. No excorations, no drainage.  Some small satellite lesions.--small, pink papules.    Psychiatric: She has a normal mood and affect. Her behavior is normal.          Assessment & Plan:

## 2011-05-01 NOTE — Patient Instructions (Signed)
Use cream as directed return in 2 weeks for recheck.

## 2011-05-03 NOTE — Assessment & Plan Note (Signed)
etilogy of rash unclear.  Now on recheck has thicker scale and is darker.  Pt states that it lightened some with antifungal cream but it didn't go away.  Will try combination steroid/antifungal cream at this time.  If no improvement over the next 2 weeks will consider biospy.  Preceptor agrees with this plan.

## 2011-05-03 NOTE — Assessment & Plan Note (Signed)
Pt would like to discuss weight management at future appt.

## 2011-05-15 ENCOUNTER — Ambulatory Visit: Payer: Self-pay | Admitting: Family Medicine

## 2011-05-22 ENCOUNTER — Encounter: Payer: Self-pay | Admitting: Family Medicine

## 2011-05-22 ENCOUNTER — Ambulatory Visit (INDEPENDENT_AMBULATORY_CARE_PROVIDER_SITE_OTHER): Payer: Self-pay | Admitting: Family Medicine

## 2011-05-22 VITALS — BP 137/83 | HR 94 | Ht 65.0 in | Wt 220.0 lb

## 2011-05-22 DIAGNOSIS — R21 Rash and other nonspecific skin eruption: Secondary | ICD-10-CM

## 2011-05-22 DIAGNOSIS — M549 Dorsalgia, unspecified: Secondary | ICD-10-CM

## 2011-05-22 DIAGNOSIS — E669 Obesity, unspecified: Secondary | ICD-10-CM

## 2011-05-22 DIAGNOSIS — L708 Other acne: Secondary | ICD-10-CM

## 2011-05-22 DIAGNOSIS — L709 Acne, unspecified: Secondary | ICD-10-CM

## 2011-05-22 MED ORDER — NYSTATIN 100000 UNIT/GM EX POWD: CUTANEOUS | Status: DC

## 2011-05-22 NOTE — Progress Notes (Signed)
  Subjective:    Patient ID: Sydney Pierce, female    DOB: [birth year], 26 y.o.   MRN: [id number]  HPI Rash: Patient reports that steroid and antifungal combination cream has decreased in thickness a rash but the rash is still present. Has used this twice a day times for 3 weeks.  Seems that the rash is worse when she sweats and works out. Improves when she is and cooler environment. Patient does get and sauna after working out. No itching. No spreading of rash. No other areas of this rash.  Back pain: Patient reports pain in right buttock area that shoots down back of leg. This occurs for maybe 2 minutes and then resolves. This may come on whenever she repositions at night. Or when she's walking. This happens about 1-3 times per day. Has never had a past. No back injuries. Sleeps on her stomach were side.no fever. No problems with bowel bladder.  Acne: Patient reports pink spots across cheeks and fore head. This is has been present x2 months. Has been using different facial cleansers. Currently using green tea facial cleanser. Has never had problems with acne in the past. No other areas of acne. No nodular cystic acne.  Weight management: Patient states she is working out 4-5 times per week at the gym. Usually about one hour. States she is doing better in nutrition. 5 social needs to meet with a nutritionist at this time. Has lost 7 pounds. Is using phentermine prescription that she got from weight loss clinic off and on.no palpitations. No chest pain.        Review of Systems    as per above. Objective:   Physical Exam  HENT:  Head: Normocephalic and atraumatic.       + red papules/acne scattered on forehead, cheeks, and chin.  + posterior neck- acanthosis nigricans.   Cardiovascular: Normal rate.   Pulmonary/Chest: Effort normal. No respiratory distress.  Musculoskeletal: She exhibits no edema.       Back exam: No tenderness on palpation of back.  Full rom of torso.  Good  strength in lower ext bilateral.  Good sensation bilateral.  Normal reflexes.  Normal gait.   Neurological: She is alert.  Skin: Rash noted.       Thick, hyperpigmented linear rash that is present between breast and extends to medial  breast fold bilateral.  A few erythematous papules surrounding lesion.  Unable to remove thickened skin with rubbing with alcohol.  Decreased dryness/thickess to lesion compared to last exam.            Assessment & Plan:

## 2011-05-22 NOTE — Patient Instructions (Signed)
Rash: Keep area dry. I will send in a prescription for powder.  And refill for cream. If not completely improved in 1-2 weeks return for biopsy.   Weight loss: KEEP UP THE GREAT WORK! If you would like nutrition consult- just let me know  Back Pain: Keep up the exercise Sciatica- see handout-- this may be the cause of your pain.  Please try exercises and see if this helps.  Don't sleep on belly.   Acne: cetaphil cleanser cerave lotion  Return in 1 month if no improvement.

## 2011-05-23 ENCOUNTER — Telehealth: Payer: Self-pay | Admitting: *Deleted

## 2011-05-23 MED ORDER — CHORIONIC GONADOTROPIN 10000 UNITS IM SOLR: 10000.0000 [IU] | Freq: Once | INTRAMUSCULAR | Status: DC

## 2011-05-23 NOTE — Telephone Encounter (Signed)
Pt has had a nml period on her own the past  2 months and wants to start a Pergonal cycle with her next period in Feb. Per previous notes will do D3 Baseline [place] and then proceed with 150units Pergonal or generic D3-D11, with [place] on D12. Then trigger with HCG when needed with IUI. If you can sign a RX for HCG 10000 she goes to a pharmacy in [place] where it is cheaper for her.

## 2011-05-23 NOTE — Telephone Encounter (Signed)
Pt has had a nml period on her own the past 2 months and wants to start a Pergonal cycle with her next period in Feb. Per previous notes will do D3 Baseline [place] and then proceed with 150units Pergonal or generic D3-D11, with [place] on D12. Then trigger with HCG when needed with IUI. If you can sign a RX for HCG 10000 she goes to a pharmacy in [place] where it is cheaper for her. Prescription sign we'll proceed with the above protocol.

## 2011-05-23 NOTE — Telephone Encounter (Signed)
rx mailed to pt and she will call with next period. kw

## 2011-05-24 DIAGNOSIS — L709 Acne, unspecified: Secondary | ICD-10-CM | POA: Insufficient documentation

## 2011-05-24 NOTE — Assessment & Plan Note (Signed)
Pt has lost 7 lbs with daily exercise in gym for the past 2 weeks.  Also using some phenteramine that she got from an outside bariatric clinic.  I encouraged her not to use this if she is desiring fertility since this is not recommended in pregnant women. I also told her that I do not feel comfortable prescribing these medications for her but that she can return to bariatric clinic for consult as needed if she would like.  Pt states understanding. Again offered for pt to meet with our nutritionist at [facility].  Pt to call for an appointment.

## 2011-05-24 NOTE — Assessment & Plan Note (Signed)
No pain currently.  Pain transient.  Does describe pain in buttock with radiation to leg so it could be transient sciatica symptoms.  No red flags on exam today. Pt given home PT to do to possible help symptoms.  Pt can take motrin prn.  Pt to return if any new or worsening of symptoms.

## 2011-05-24 NOTE — Assessment & Plan Note (Signed)
Will try nystatin powder to see if keeping area dryer with powder instead of cream seems to help.  Pt to return in 2 weeks if still not resolved.  Did KOH scraping today that was negative but area of rash and satellite lesions still make me suspicious for fungal cause.  Plus some improvement with anitfungal cream. Will continue to treat as fungus for the next 2 weeks.  If still present in 2 weeks will have another preceptor exam to see if they have an recommendations.  I also have a low threshold for biospy.  Although I do think that this is a benign process, pt would like to know exact diagnosis.  It may be necessary to do biopsy in order to give her diagnosis.

## 2011-05-24 NOTE — Assessment & Plan Note (Signed)
Pt to stop using strong facial cleansers with possible irritants. Since this is a new problem for patient I feel that this most likely is due to irritation.  Could also be hormonally driven due to patients likely diagnosis of PCOS.  Pt to use cetaphil cleanser and cerave lotion bid until f/up.  Will reassess skin at that time and see if additional action needed.

## 2011-05-25 ENCOUNTER — Telehealth: Payer: Self-pay | Admitting: *Deleted

## 2011-05-25 NOTE — Telephone Encounter (Signed)
Pharmacy notified.

## 2011-05-25 NOTE — Telephone Encounter (Signed)
Please let them know that that will be fine- to prescribe them both separately. Thanks for your help with this.

## 2011-05-25 NOTE — Telephone Encounter (Signed)
Received call from [facility] pharmacy stating they are unable to provide clotrimazole -betamethasone for patient . They can provide nystatin and triamcinolone  separately. Will forward to Dr. Edmonia James.

## 2011-05-29 ENCOUNTER — Telehealth: Payer: Self-pay | Admitting: Family Medicine

## 2011-05-29 NOTE — Telephone Encounter (Signed)
Pt is asking about the results from last week and to let her know that the rash is not better - she is working today and the best # is [phone]

## 2011-05-29 NOTE — Telephone Encounter (Signed)
Will forward to Dr Caviness 

## 2011-05-30 NOTE — Telephone Encounter (Signed)
Spoke with pt on the phone.  Instructed her to return for recheck in 2 weeks if rash not completely resolved.  Pt instructed to combine the antifungal and steroid creams since she was unable to afford the combination cream.  Pt states understanding.

## 2011-06-19 ENCOUNTER — Telehealth: Payer: Self-pay | Admitting: *Deleted

## 2011-06-19 MED ORDER — MEDROXYPROGESTERONE ACETATE 10 MG PO TABS: 10.0000 mg | ORAL_TABLET | Freq: Every day | ORAL | Status: DC

## 2011-06-19 NOTE — Telephone Encounter (Signed)
Pt called with no period, UPT at home neg. Needs to start period so can do Ovulation induction in March. Ok for Provera 10mg  for 10days. Call with period  [facility]

## 2011-06-22 ENCOUNTER — Other Ambulatory Visit: Payer: Self-pay | Admitting: Gynecology

## 2011-06-22 DIAGNOSIS — N831 Corpus luteum cyst of ovary, unspecified side: Secondary | ICD-10-CM

## 2011-06-22 DIAGNOSIS — N8300 Follicular cyst of ovary, unspecified side: Secondary | ICD-10-CM

## 2011-06-23 ENCOUNTER — Ambulatory Visit (INDEPENDENT_AMBULATORY_CARE_PROVIDER_SITE_OTHER): Payer: Self-pay | Admitting: Gynecology

## 2011-06-23 ENCOUNTER — Telehealth: Payer: Self-pay | Admitting: *Deleted

## 2011-06-23 ENCOUNTER — Ambulatory Visit (INDEPENDENT_AMBULATORY_CARE_PROVIDER_SITE_OTHER): Payer: Self-pay

## 2011-06-23 DIAGNOSIS — N831 Corpus luteum cyst of ovary, unspecified side: Secondary | ICD-10-CM

## 2011-06-23 DIAGNOSIS — N8300 Follicular cyst of ovary, unspecified side: Secondary | ICD-10-CM

## 2011-06-23 DIAGNOSIS — N979 Female infertility, unspecified: Secondary | ICD-10-CM

## 2011-06-23 DIAGNOSIS — N970 Female infertility associated with anovulation: Secondary | ICD-10-CM | POA: Insufficient documentation

## 2011-06-23 DIAGNOSIS — N915 Oligomenorrhea, unspecified: Secondary | ICD-10-CM

## 2011-06-23 NOTE — Progress Notes (Signed)
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 Patient with history of oligomenorrhea/PCO S. Prior fertility workup as follows:  Normal HSG in 2012 Semen analysis in 2007 (high viscosity increased abnormal forms) Normal hemoglobin A1c  Patient is gone through clomiphene citrate with maximum dose of 200 mg with good response noted on serum progesterone level. Also she underwent a term washing and IUI as well. Dexamethasone had been used as well from day 2 through 18 as well as metformin 500 mg 3 times a day with no success.  Patient's and opted to proceed with superovulation and IUI. The risks benefits and pros and cons were discussed in detail to include superovulation hyperstimulation syndrome as well as multiple gestation. Instructions and calcium was undertaken Spanish and all questions were answered.  Patient presented to the office today on day 3 of her cycle for follicle study. Ultrasound with endometrial stripe of 3.2 mm with several follicles on both ovaries less than 10 mm in size. Patient will start on the following regimen:  Week will start her on gonadotropin (Pergonal 150 units) day 3 to day 12. She'll come to the office on day 12 for an ultrasound for follicle study and determination of triggering with hCG 10,000 units with followup sperm washing and IUI 24 hours after injection. We'll also obtain estradiol level on day 12 as well. Patient will continue her prenatal vitamins. When patient comes into the office we'll need to draw her prolactin. Patient is no longer taking metformin.

## 2011-06-23 NOTE — Telephone Encounter (Signed)
Pt started her period and so therefore I advised she needed to come on D3 for [place] to begin her cycle. Appt made kw

## 2011-06-27 ENCOUNTER — Other Ambulatory Visit: Payer: Self-pay | Admitting: *Deleted

## 2011-06-27 ENCOUNTER — Ambulatory Visit: Payer: Self-pay | Admitting: Gynecology

## 2011-06-27 ENCOUNTER — Other Ambulatory Visit: Payer: Self-pay

## 2011-06-27 DIAGNOSIS — N979 Female infertility, unspecified: Secondary | ICD-10-CM

## 2011-06-30 ENCOUNTER — Ambulatory Visit (INDEPENDENT_AMBULATORY_CARE_PROVIDER_SITE_OTHER): Payer: Self-pay

## 2011-06-30 ENCOUNTER — Ambulatory Visit (INDEPENDENT_AMBULATORY_CARE_PROVIDER_SITE_OTHER): Payer: Self-pay | Admitting: Gynecology

## 2011-06-30 DIAGNOSIS — N979 Female infertility, unspecified: Secondary | ICD-10-CM

## 2011-06-30 DIAGNOSIS — N970 Female infertility associated with anovulation: Secondary | ICD-10-CM

## 2011-06-30 NOTE — Progress Notes (Signed)
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 Patient is currently on stimulated cycle consisting of Pergonal which was started on day 3 of her cycle and she presented to the office today for follicle study.  Ultrasound today date 10 of her cycle: Right ovary 2 follicles 5.3 and 5.9 mm respectively Left ovary 3 follicles 13.9, 14.6, and 12.1 mm respectively Endometrium try layered  Patient was instructed to administer the Pergonal today and tomorrow and on Sunday which will be day 12 she'll administer the hCG 10,000 units IM and returned to the office the following day which would be day 13 for sperm washing and IUI. She was asked to refrain from intercourse until after the IUI.  Patient had previously been counseled as to potential risk of superovulation as well as multiple gestation and fully except the risk.

## 2011-07-03 ENCOUNTER — Ambulatory Visit (INDEPENDENT_AMBULATORY_CARE_PROVIDER_SITE_OTHER): Payer: Self-pay | Admitting: Gynecology

## 2011-07-03 ENCOUNTER — Encounter: Payer: Self-pay | Admitting: Gynecology

## 2011-07-03 VITALS — BP 120/76

## 2011-07-03 DIAGNOSIS — N970 Female infertility associated with anovulation: Secondary | ICD-10-CM

## 2011-07-03 DIAGNOSIS — N979 Female infertility, unspecified: Secondary | ICD-10-CM

## 2011-07-03 DIAGNOSIS — N915 Oligomenorrhea, unspecified: Secondary | ICD-10-CM

## 2011-07-03 NOTE — Progress Notes (Signed)
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 Patient with normal HSG in 2012.  This is patient's first gonadotropin stimulated cycle as follows: Patient was started on Pergonal 150 units which was started on day 3 of her cycle until day 11. She was in the office for follicle study on March 8 and she had the following an ultrasound:  Right ovary 2 follicles 5.3 and 5.9 mm respectively  Left ovary 3 follicles 13.9, 14.6, and 12.1 mm respectively  Endometrium try layered  She administered the hCG 10,000 units yesterday morning. Today's date 89 of her cycle and she presented with her husband for intrauterine semination today. Single was a good sample with excellent for progression.  Intrauterine semination was carried out without any complications. Patient waited for 10 minutes at rest in the office and was released home. She is to have intercourse with her spouse tonight in the next couple of nights. Of note she had good cervical clear mucus today. She is to call the office if she does not conceive within the next 2 weeks by taking UPT at home.

## 2011-07-05 ENCOUNTER — Telehealth: Payer: Self-pay | Admitting: *Deleted

## 2011-07-05 NOTE — Telephone Encounter (Signed)
She may resume all normal activity.

## 2011-07-05 NOTE — Telephone Encounter (Signed)
Patient wants to know if she can go back to regular gym activities.  Bike... Zumba.Ardith Dark. (IUI Monday)

## 2011-07-06 NOTE — Telephone Encounter (Signed)
Patient informed. 

## 2011-07-17 ENCOUNTER — Ambulatory Visit (INDEPENDENT_AMBULATORY_CARE_PROVIDER_SITE_OTHER): Payer: Self-pay

## 2011-07-17 ENCOUNTER — Telehealth: Payer: Self-pay | Admitting: *Deleted

## 2011-07-17 ENCOUNTER — Ambulatory Visit (INDEPENDENT_AMBULATORY_CARE_PROVIDER_SITE_OTHER): Payer: Self-pay | Admitting: Gynecology

## 2011-07-17 DIAGNOSIS — N8300 Follicular cyst of ovary, unspecified side: Secondary | ICD-10-CM

## 2011-07-17 DIAGNOSIS — N979 Female infertility, unspecified: Secondary | ICD-10-CM

## 2011-07-17 NOTE — Telephone Encounter (Signed)
Pt had IUI 3/11. She started her period yesterday. She wants to do another cycle. The office has no Ultrasound available on D3 therefore she will come today D2 of cycle.

## 2011-07-17 NOTE — Progress Notes (Signed)
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 Patient did not conceive on last cycle see previous note dated March 11.  Patient did not conceive with the first in her late cycle. Her last menstrual period was March 24 today is day 2 of her cycle. She presented to the office for baseline ultrasound and E2. Ultrasound demonstrated right ovary had several follicles less than 4 mm and left ovary had one thickwalled corpus luteum cyst measuring 24 x 18 mm avascular and there was some fluid in the cul-de-sac.  I discussed with the patient that the presence of the small functional cyst that there is a possibility she may not respond with a cycle or the potential risk of hyperstimulation she would like to proceed and start the second cycle and accepts the risk to include also multiple gestation.  She will start Pergonal 150 units tomorrow for 12 days and will return to the office on day 12 for an ultrasound for follicle study and E2 depending on the response will trigger with hCG 10,000 units followup by sperm washing and IUI within 24-36 hours. All the above was explained to the patient once again in Spanish and all questions were answered and we'll follow accordingly. She will continue on her prenatal vitamins

## 2011-07-18 LAB — ESTRADIOL: Estradiol: 19.2 pg/mL

## 2011-07-27 ENCOUNTER — Encounter: Payer: Self-pay | Admitting: Gynecology

## 2011-07-27 ENCOUNTER — Ambulatory Visit (INDEPENDENT_AMBULATORY_CARE_PROVIDER_SITE_OTHER): Payer: Self-pay

## 2011-07-27 ENCOUNTER — Ambulatory Visit (INDEPENDENT_AMBULATORY_CARE_PROVIDER_SITE_OTHER): Payer: Self-pay | Admitting: Gynecology

## 2011-07-27 DIAGNOSIS — N979 Female infertility, unspecified: Secondary | ICD-10-CM

## 2011-07-27 DIAGNOSIS — N8300 Follicular cyst of ovary, unspecified side: Secondary | ICD-10-CM

## 2011-07-27 NOTE — Progress Notes (Signed)
Patient presented to the office today for follicle study. See previous note dated March 25. Patient's last menstrual period was March 24. She had a baseline ultrasound on day 2 of her cycle and was subsequently started on Repronex 150 units daily. Today's date 12 of her cycle. Day 2 estradiol is 19.2  Ultrasound report: Right ovary: one follicle 11.9 mm, second follicle 10.9 mm and third follicle 14.9 mm Left ovary one follicle 7.2 mm Endometrium tried layered and a corpus luteum cyst measuring 20 x 21 x 50 mm was noted on the left.  The patient will receive today day 12 her final Repronex injection. Tomorrow on day 13 she will receive the hCG (10,000 units) and on the following day day 14 she will have her sperm washing and intrauterine insemination done in [place]. All instructions provided and we'll follow accordingly. She had left the office before we were able to draw her estradiol level so we'll contact her to come in tomorrow to have a drawn. Of note she is currently on metformin 500 mg daily. Patient with history of PCO S.

## 2011-07-28 ENCOUNTER — Telehealth: Payer: Self-pay | Admitting: *Deleted

## 2011-07-28 LAB — ESTRADIOL: Estradiol: 39.2 pg/mL

## 2011-07-28 NOTE — Telephone Encounter (Signed)
Informed Sperm washing and IUI will be done at [facility] in [facility] at [address] 2nd floor. At 10am. Pt states will be there. Will do Ovidrel injection today, she was inofmred this at 930am and will do shot now. KW

## 2011-08-04 ENCOUNTER — Encounter: Payer: Self-pay | Admitting: Gynecology

## 2011-08-07 ENCOUNTER — Ambulatory Visit: Payer: Self-pay | Admitting: Family Medicine

## 2011-08-14 ENCOUNTER — Ambulatory Visit (INDEPENDENT_AMBULATORY_CARE_PROVIDER_SITE_OTHER): Payer: Self-pay | Admitting: Family Medicine

## 2011-08-14 ENCOUNTER — Encounter: Payer: Self-pay | Admitting: Family Medicine

## 2011-08-14 ENCOUNTER — Telehealth: Payer: Self-pay | Admitting: *Deleted

## 2011-08-14 VITALS — BP 120/82 | HR 72 | Temp 98.1°F | Ht 65.0 in | Wt 223.0 lb

## 2011-08-14 DIAGNOSIS — R21 Rash and other nonspecific skin eruption: Secondary | ICD-10-CM

## 2011-08-14 DIAGNOSIS — J309 Allergic rhinitis, unspecified: Secondary | ICD-10-CM

## 2011-08-14 DIAGNOSIS — J302 Other seasonal allergic rhinitis: Secondary | ICD-10-CM

## 2011-08-14 NOTE — Assessment & Plan Note (Signed)
Pt to continue to take OTC anti allergy medicines.  Encouraged pt to try other brand such as zyrtec to see which one works best for her- then use daily. If pt begins to have more nasal and sinus symptoms could consider steroid inhaler- but not needed at this time.

## 2011-08-14 NOTE — Telephone Encounter (Signed)
Pt called started her period 08/10/11. 3 day normal period. She willl call in about 6  Months to start superovulation again.

## 2011-08-14 NOTE — Progress Notes (Signed)
  Subjective:    Patient ID: Sydney Pierce, female    DOB: [birth year], 26 y.o.   MRN: [id number]  HPI Rash:  mid chest and under breast- present x 1 year now.  Has used antifungal cream without any improvement.  The combination antifungal and steroid cream seemed to help but didn't go completely away.  The health department ran out of the combination cream and she had been mixing the antifungal and steroid at home--with no improvement.  Have discussed biopsy in the past but have to medically treat with topical creams.  Rash continues- seems worse and darker than when it initally started.  No other similar rash on body but now going under breast.   Typically not itchy.  No fever. No new detergents. No new creams. No frangrances.  Here requesting biopsy. No n/v/d.    Allergies: Pt reports cough and itchy throat with recent pollen exposure.  Has been taking claritin with no relief.  No wheezing.  Minimal runny nose.  Occasional watery/itchy eyes.  Biggest concern is cough and would like to know what she can do to improve it.  No sob.  No chest pain. No fever.   Review of Systems As per above    Objective:   Physical Exam  Constitutional: She appears well-developed and well-nourished.       obese  HENT:  Head: Normocephalic and atraumatic.  Cardiovascular: Normal rate, regular rhythm and normal heart sounds.   No murmur heard. Pulmonary/Chest: Effort normal and breath sounds normal. No respiratory distress. She has no wheezes. She has no rales.  Musculoskeletal: She exhibits no edema.  Neurological: She is alert.  Skin: Rash noted.       Thick, hyperpigmented linear rash that is present between breast and extends to medial  breast fold bilateral.  A few erythematous papules surrounding lesion. Increased darkness, thickess- rash covering slightly larger area compared to last exam.   Psychiatric: She has a normal mood and affect.   Punch biopsy procedure note: After informed written consent  was obtained, using Betadine for cleansing and 1% Lidocaine with epinephrine for anesthetic, with sterile technique a 3 mm punch biopsy was used to obtain a biopsy specimen of the lesion. Hemostasis was obtained by pressure.  No sutures needed. Antibiotic dressing is applied, and wound care instructions provided. Be alert for any signs of cutaneous infection. The specimen is labeled and sent to pathology for evaluation. The procedure was well tolerated without complications.      Assessment & Plan:

## 2011-08-14 NOTE — Assessment & Plan Note (Signed)
Punch biopsy obtained, sent to lab.  Pt given instructions regarding care of punch biopsy site and red flags for return.

## 2011-08-22 ENCOUNTER — Telehealth: Payer: Self-pay | Admitting: Family Medicine

## 2011-08-22 NOTE — Telephone Encounter (Signed)
Patient is calling for the results of testing she had done over a week ago.

## 2011-08-23 ENCOUNTER — Telehealth: Payer: Self-pay | Admitting: *Deleted

## 2011-08-23 ENCOUNTER — Other Ambulatory Visit: Payer: Self-pay | Admitting: Family Medicine

## 2011-08-23 MED ORDER — MINOCYCLINE HCL 100 MG PO CAPS: 100.0000 mg | ORAL_CAPSULE | Freq: Two times a day (BID) | ORAL | Status: DC

## 2011-08-23 NOTE — Telephone Encounter (Signed)
Patient came into office requesting biopsy results from 08/14/11.  Has rash between breasts and it was biopsied to check for fungal infection.  Dr. Edmonia James in clinic tomorrow.  Will route note to Dr. Edmonia James and call patient back.  Gaylene Brooks, RN

## 2011-08-23 NOTE — Telephone Encounter (Signed)
Called pt back to discuss biopsy result- discussed that in some studies this rash has responded to antibiotics.  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 Will do minocycline 100mg  po bid x 6 weeks.   If no improvement, could still be unresponsive type. Also could also try topical retinoid- which has been shown to help with acanthosis nigricans- since this is also on differential. .  Will continue to encouraged weight loss.  Both rashes on pathology differential are benign and the above treatments are just to hopefully help with cosmetic apperance. See literature reference below:   J Am Acad Dermatol. 2001 Apr;44(4):652-5.  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 Jang HS, Oh CK, Cha JH, Cho SH, [place].  Source Department of Dermatology, [employer], [facility] [place], [place]. [email or web address]  J Dtsch Dermatol Ges. 2006 Jul;4(7):556-8.  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 [Article in [employer], [place], Rcken M, Schaller M.  Partially discussed above with patient on the phone.  Pt requests that we talk more tomorrow when she is not at work. We will discuss in more detail at that time.

## 2011-08-23 NOTE — Telephone Encounter (Signed)
Attempt x 1 at 8:30am to call pt.  Left message that I will call her back later today.

## 2011-08-24 ENCOUNTER — Encounter: Payer: Self-pay | Admitting: Family Medicine

## 2011-08-24 ENCOUNTER — Ambulatory Visit (INDEPENDENT_AMBULATORY_CARE_PROVIDER_SITE_OTHER): Payer: Self-pay | Admitting: Family Medicine

## 2011-08-24 VITALS — BP 106/73 | HR 75 | Ht 65.0 in | Wt 219.0 lb

## 2011-08-24 DIAGNOSIS — L83 Acanthosis nigricans: Secondary | ICD-10-CM

## 2011-08-24 DIAGNOSIS — L918 Other hypertrophic disorders of the skin: Secondary | ICD-10-CM

## 2011-08-24 DIAGNOSIS — L908 Other atrophic disorders of skin: Secondary | ICD-10-CM

## 2011-08-24 MED ORDER — AZITHROMYCIN 250 MG PO TABS: ORAL_TABLET | ORAL | Status: AC

## 2011-08-24 MED ORDER — AZITHROMYCIN 250 MG PO TABS: ORAL_TABLET | ORAL | Status: DC

## 2011-08-26 DIAGNOSIS — L83 Acanthosis nigricans: Secondary | ICD-10-CM | POA: Insufficient documentation

## 2011-08-26 NOTE — Progress Notes (Signed)
  Subjective:    Patient ID: Sydney Pierce, female    DOB: July 14, 1985, 26 y.o.   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  Discussed that both are benign processes.  Discussed that treatment is for cosmetic purposes only.  Retin a recommended for acanthosis nigricans.  For papillomatosis- some case studies show improvement with antibiotics.  Pt states that she would like to try antibiotic.  She is aware that this is not well researched and treatment is based on dermatologic case studies only.  Pt has had problems with infertility and would like an antibiotic that would be safe if she were to become pregnant.   Biopsy site healing well.  Initially some clear drainage.  No redness. No fever.  No signs of infection.    Review of Systems As per above.     Objective:   Physical Exam  Constitutional: She appears well-developed and well-nourished.  Cardiovascular: Normal rate.   Pulmonary/Chest: Effort normal. No respiratory distress.  Skin: No rash noted.       Well healing punch biopsy, base of right breast- medial lower quadrant.  No drainage. No redness. No pus.   Psychiatric: She has a normal mood and affect.          Assessment & Plan:

## 2011-08-26 NOTE — Assessment & Plan Note (Signed)
Per path report this is possible diagnosis- I favor dx of acanthosis nigricans- yet, after discussing path and treatment options, and the fact that pt feels that rash on her neck seems different than the rash on mid chest- we decided to do trial of antibiotics.  Decided to try azithromycin since pt is not on birth control- low risk of pregnancy since PCOS- not currently receiving fertility treatments.  Per Journal of American Academy of Dermatology- 2001 April 44 (4) 652-5- article: 6 cases of confluent and reticulated papillamatosis alleviated by various antibiotics- name azithromycin 500mg  3 x per week x 3-4 weeks as possible regimen.  Will proceed with this.  Pt to return in 3 weeks for recheck.

## 2011-09-14 ENCOUNTER — Ambulatory Visit: Payer: Self-pay | Admitting: Family Medicine

## 2011-10-11 ENCOUNTER — Ambulatory Visit (INDEPENDENT_AMBULATORY_CARE_PROVIDER_SITE_OTHER): Payer: Self-pay | Admitting: *Deleted

## 2011-10-11 DIAGNOSIS — Z111 Encounter for screening for respiratory tuberculosis: Secondary | ICD-10-CM

## 2011-10-11 DIAGNOSIS — Z23 Encounter for immunization: Secondary | ICD-10-CM

## 2011-10-12 DIAGNOSIS — Z23 Encounter for immunization: Secondary | ICD-10-CM

## 2011-10-12 DIAGNOSIS — Z111 Encounter for screening for respiratory tuberculosis: Secondary | ICD-10-CM

## 2011-10-13 ENCOUNTER — Ambulatory Visit (INDEPENDENT_AMBULATORY_CARE_PROVIDER_SITE_OTHER): Payer: Self-pay | Admitting: *Deleted

## 2011-10-13 ENCOUNTER — Ambulatory Visit: Payer: Self-pay | Admitting: Family Medicine

## 2011-10-13 DIAGNOSIS — R7611 Nonspecific reaction to tuberculin skin test without active tuberculosis: Secondary | ICD-10-CM

## 2011-10-13 LAB — TB SKIN TEST: TB Skin Test: POSITIVE

## 2011-10-13 NOTE — Progress Notes (Signed)
Patient in office to read PPD. 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 Dr. Deirdre Priest came in to verify reading.  He advises for patient to return to our office for follow up regarding this .  Appointment scheduled Monday 06/24.

## 2011-10-16 ENCOUNTER — Ambulatory Visit (INDEPENDENT_AMBULATORY_CARE_PROVIDER_SITE_OTHER): Payer: Self-pay | Admitting: Family Medicine

## 2011-10-16 ENCOUNTER — Ambulatory Visit (HOSPITAL_COMMUNITY)
Admission: RE | Admit: 2011-10-16 | Discharge: 2011-10-16 | Disposition: A | Discharge status: Home / Self Care | Payer: Self-pay | Source: Ambulatory Visit | Attending: Family Medicine | Admitting: Family Medicine

## 2011-10-16 VITALS — BP 113/77 | HR 83 | Temp 97.5°F | Ht 65.0 in | Wt 223.8 lb

## 2011-10-16 DIAGNOSIS — A150 Tuberculosis of lung: Secondary | ICD-10-CM

## 2011-10-16 DIAGNOSIS — Z227 Latent tuberculosis: Secondary | ICD-10-CM

## 2011-10-16 DIAGNOSIS — R7611 Nonspecific reaction to tuberculin skin test without active tuberculosis: Secondary | ICD-10-CM | POA: Insufficient documentation

## 2011-10-16 MED ORDER — VITAMIN B-6 25 MG PO TABS: 25.0000 mg | ORAL_TABLET | Freq: Every day | ORAL | Status: DC

## 2011-10-16 MED ORDER — ISONIAZID 300 MG PO TABS: 300.0000 mg | ORAL_TABLET | Freq: Every day | ORAL | Status: AC

## 2011-10-16 NOTE — Progress Notes (Signed)
  Subjective:    Patient ID: Sydney Pierce, female    DOB: [birth year], 26 y.o.   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  Was checked last week, found to have a 15mm induration consistent with positive PPD.  States she previously had a negative PPD 4 years ago.  No cough No fever No weight loss No international travel No incarceration No exposure to known TB  All of family is from [place]- no recent travel or contacts with recent travelers. Review of Systems    see HPI Objective:   Physical Exam GEN: Alert & Oriented, No acute distress Neck: no lad CV:  Regular Rate & Rhythm, no murmur Respiratory:  Normal work of breathing, CTAB         Assessment & Plan:

## 2011-10-16 NOTE — Assessment & Plan Note (Signed)
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  She declined to go due to inconvenience and I agreed to treat her here in our clinic.  We discussed importance of pro/cons of treatment and importance of 9 month course and monthly follow-up.  She then told me that when she had previously told me she is not taking fertility meds is untrue and is actively undergoing IVF, last treatment less than one month ago.    I discussed with patient that it is now even more important she discuss with health department risks/vs benefits and I have not treated latent TB in pregnancy.  I referred her to the health department for further treatment

## 2011-10-16 NOTE — Patient Instructions (Addendum)
Go to hospital and get xray Important to take medicine every day - isoniazid Will take it for 9 months Follow-up in 30 days  If you notice, cough, fever, chills, abdominal pain, or other concerns, please contact office

## 2011-10-27 ENCOUNTER — Encounter: Payer: Self-pay | Admitting: Family Medicine

## 2011-12-15 ENCOUNTER — Ambulatory Visit (INDEPENDENT_AMBULATORY_CARE_PROVIDER_SITE_OTHER): Payer: Self-pay | Admitting: Family Medicine

## 2011-12-15 ENCOUNTER — Encounter: Payer: Self-pay | Admitting: Family Medicine

## 2011-12-15 VITALS — BP 106/74 | HR 79 | Temp 97.9°F | Ht 65.0 in | Wt 224.0 lb

## 2011-12-15 DIAGNOSIS — N939 Abnormal uterine and vaginal bleeding, unspecified: Secondary | ICD-10-CM

## 2011-12-15 DIAGNOSIS — N912 Amenorrhea, unspecified: Secondary | ICD-10-CM

## 2011-12-15 DIAGNOSIS — L83 Acanthosis nigricans: Secondary | ICD-10-CM

## 2011-12-15 DIAGNOSIS — N898 Other specified noninflammatory disorders of vagina: Secondary | ICD-10-CM

## 2011-12-15 LAB — POCT GLYCOSYLATED HEMOGLOBIN (HGB A1C): Hemoglobin A1C: 6.5

## 2011-12-15 LAB — POCT URINE PREGNANCY: Preg Test, Ur: NEGATIVE

## 2011-12-15 MED ORDER — TRETINOIN 0.01 % EX GEL: Freq: Every day | CUTANEOUS | Status: DC

## 2011-12-15 NOTE — Patient Instructions (Addendum)
It was a pleasure to see you today.  Given the distribution of the discoloration of the skin, and the results of previous biopsy, I am still considering acanthosis nigricans as a possibility.  I am prescribing Tretinoin 0.1% gel, to be applied daily to affected areas of skin, one time daily for two weeks only. Follow up here after you have used for 2 weeks. Paper prescription.

## 2011-12-15 NOTE — Progress Notes (Signed)
  Subjective:    Patient ID: Sydney Pierce, female    DOB: [birth year], 26 y.o.   MRN: [id number]  HPI Patient here with complaint of extension of hyperpigmentation along skin btwn breasts, now extending onto inner thighs, axillary area.  Has notable acanthosis nigricans along nape of neck as well.  Says the skin complaint is not itchy or painful; no suppuration or fluctuance to it.  She notes that she was receiving fertility treatments with Dr Reynaldo Minium until April of this year; her last menses was May 29th, 2013.   She notes some weight gain since the beginning of this year.  She reports that she had been on Metformin to try and induce ovulation in the past, but was unsuccessful.  She has not been diagnosed with DM or IFG in the past, but her father is an adult-onset diabetic.    Prior biopsy of skin between breasts with Dr Edmonia Maliki Gignac; pathologist differential dx includes acanthosis vs reticulated papillomatosis.  Review of SystemsSee above     Objective:   Physical Exam Well appearing, no apparent distress SKIN: Hyperpigmentation with crusting appearance btwn breasts; also hyperpigmentation without thickening along axillae, inner thighs.  Nape of neck with velvety hyperpigmented appearance.        Assessment & Plan:

## 2011-12-15 NOTE — Assessment & Plan Note (Signed)
LMP Sep 20, 2011.  Anovulatory in the past.  To check Upreg today.  Consider Metformin restart at next visit in 2 weeks.

## 2011-12-15 NOTE — Assessment & Plan Note (Signed)
Noted to have skin changes consistent with acanthosis; has experienced weight gain and reports some increased thirst and water consumption.  First degree relative with DM (father).  She appears to meet criteria for PCOS (anovulation, impaired glucose metabolism).  Will recheck A1C today, consider restarting her on Metformin for this.  She is also being started on Tretinoin topical for possible acanthosis nigricans versus papillomatosis, for 2 weeks only.  UPreg today for history of LMP Sep 20, 2011.  She called the [facility] program on her cell phone from the room, they can order the tretinoin either gel or cream. Paper prescription given.

## 2012-01-05 ENCOUNTER — Ambulatory Visit: Payer: Self-pay | Admitting: Family Medicine

## 2012-08-25 ENCOUNTER — Encounter (HOSPITAL_COMMUNITY): Payer: Self-pay | Admitting: Emergency Medicine

## 2012-08-25 ENCOUNTER — Emergency Department (HOSPITAL_COMMUNITY)
Admission: EM | Admit: 2012-08-25 | Discharge: 2012-08-25 | Disposition: A | Discharge status: Home / Self Care | Payer: Self-pay | Attending: Emergency Medicine | Admitting: Emergency Medicine

## 2012-08-25 DIAGNOSIS — S058X9A Other injuries of unspecified eye and orbit, initial encounter: Secondary | ICD-10-CM | POA: Insufficient documentation

## 2012-08-25 DIAGNOSIS — Z8742 Personal history of other diseases of the female genital tract: Secondary | ICD-10-CM | POA: Insufficient documentation

## 2012-08-25 DIAGNOSIS — Y929 Unspecified place or not applicable: Secondary | ICD-10-CM | POA: Insufficient documentation

## 2012-08-25 DIAGNOSIS — H538 Other visual disturbances: Secondary | ICD-10-CM | POA: Insufficient documentation

## 2012-08-25 DIAGNOSIS — Z862 Personal history of diseases of the blood and blood-forming organs and certain disorders involving the immune mechanism: Secondary | ICD-10-CM | POA: Insufficient documentation

## 2012-08-25 DIAGNOSIS — S0502XA Injury of conjunctiva and corneal abrasion without foreign body, left eye, initial encounter: Secondary | ICD-10-CM

## 2012-08-25 DIAGNOSIS — IMO0002 Reserved for concepts with insufficient information to code with codable children: Secondary | ICD-10-CM | POA: Insufficient documentation

## 2012-08-25 DIAGNOSIS — Z79899 Other long term (current) drug therapy: Secondary | ICD-10-CM | POA: Insufficient documentation

## 2012-08-25 DIAGNOSIS — Z8639 Personal history of other endocrine, nutritional and metabolic disease: Secondary | ICD-10-CM | POA: Insufficient documentation

## 2012-08-25 DIAGNOSIS — Y9389 Activity, other specified: Secondary | ICD-10-CM | POA: Insufficient documentation

## 2012-08-25 MED ORDER — ERYTHROMYCIN 5 MG/GM OP OINT
1.0000 "application " | TOPICAL_OINTMENT | Freq: Once | OPHTHALMIC | Status: AC
  Administered 2012-08-25: 1 via OPHTHALMIC
  Filled 2012-08-25: qty 1

## 2012-08-25 MED ORDER — FLUORESCEIN SODIUM 1 MG OP STRP
4.0000 | ORAL_STRIP | Freq: Once | OPHTHALMIC | Status: AC
  Administered 2012-08-25: 14:00:00 via OPHTHALMIC
  Filled 2012-08-25: qty 4

## 2012-08-25 MED ORDER — TRAMADOL HCL 50 MG PO TABS: 50.0000 mg | ORAL_TABLET | Freq: Four times a day (QID) | ORAL | Status: DC | PRN

## 2012-08-25 MED ORDER — TETRACAINE HCL 0.5 % OP SOLN
2.0000 [drp] | Freq: Once | OPHTHALMIC | Status: AC
  Administered 2012-08-25: 2 [drp] via OPHTHALMIC
  Filled 2012-08-25: qty 2

## 2012-08-25 NOTE — ED Provider Notes (Signed)
History     CSN: [id number]  Arrival date & time 08/25/12  1119   First MD Initiated Contact with Patient 08/25/12 1132      Chief Complaint  Patient presents with  . Eye Injury    (Consider location/radiation/quality/duration/timing/severity/associated sxs/prior treatment) HPI  Sydney Pierce is a 27 y.o. female complaining of 3 hours of eye pain. Pt states that this morning when she was getting ready for work she was bent over in her closet when she hit her eye on the heel of a hanging stiletto shoe. She immediately felt pain and realized that her vision was blurry. She states that the pain is worse when blinking and moving her eyes left to right but does not feel pain when looking up or down. She states that since being in the ED her blurry vision has resolved and she is back to baseline vision. She denies any discharge, blood, orbital bone pain, or other injury. She normally puts allergic eye drops in her eye but did not use them today and does not wear contact lenses. His rates her pain at 10  Past Medical History  Diagnosis Date  . Infertility associated with anovulation   . PCOS (polycystic ovarian syndrome)   . Oligomenorrhea   . No pertinent past medical history   . Infertility associated with anovulation     Past Surgical History  Procedure Laterality Date  . No past surgeries      Family History  Problem Relation Age of Onset  . Diabetes Father   . Hyperlipidemia Father   . Heart disease Father   . Hypertension Father   . Kidney disease Father     History  Substance Use Topics  . Smoking status: Never Smoker   . Smokeless tobacco: Not on file  . Alcohol Use: Yes     Comment: 4 beers/year    OB History   Grav Para Term Preterm Abortions TAB SAB Ect Mult Living   0               Review of Systems  Constitutional: Negative for fever.  Eyes: Positive for pain and redness. Negative for photophobia.  Respiratory: Negative for shortness of breath.     Cardiovascular: Negative for chest pain.  Gastrointestinal: Negative for nausea, vomiting, abdominal pain and diarrhea.  All other systems reviewed and are negative.    Allergies  Review of patient's allergies indicates no known allergies.  Home Medications   Current Outpatient Rx  Name  Route  Sig  Dispense  Refill  . cetirizine (ZYRTEC) 10 MG tablet   Oral   Take 10 mg by mouth daily.         Marland Kitchen Ketotifen Fumarate (ALLERGY EYE DROPS OP)   Both Eyes   Place 2 drops into both eyes 4 (four) times daily.         . traMADol (ULTRAM) 50 MG tablet   Oral   Take 1 tablet (50 mg total) by mouth every 6 (six) hours as needed for pain.   15 tablet   0     BP 117/75  Pulse 96  Temp(Src) 97.5 F (36.4 C) (Oral)  Resp 20  SpO2 97%  Physical Exam  Nursing note and vitals reviewed. Constitutional: She is oriented to person, place, and time. She appears well-developed and well-nourished. No distress.  HENT:  Head: Normocephalic.  Eyes: EOM and lids are normal. Pupils are equal, round, and reactive to light. No foreign bodies found. Right eye  exhibits no discharge. No foreign body present in the right eye. Right conjunctiva is injected. Right eye exhibits normal extraocular motion and no nystagmus. Right pupil is round and reactive. Pupils are equal.    Negative Seidel test  Right lateral corneal subconjunctival injection. There is a corneal abrasion in the same area.  Normal anterior chamber no photophobia or flare.   Cardiovascular: Normal rate.   Pulmonary/Chest: Effort normal. No stridor.  Musculoskeletal: Normal range of motion.  Neurological: She is alert and oriented to person, place, and time.  Psychiatric: She has a normal mood and affect.    ED Course  Procedures (including critical care time)  Labs Reviewed - No data to display No results found.   1. Corneal abrasion, left, initial encounter       MDM   [name] is a 27 y.o. female right  lateral thigh pain after scraping it on the heel of a stiletto shoe earlier in the day. Patient's visual acuity is normal bilaterally. Seidel test is negative, anterior chamber is normal there is a corneal abrasion just outside the area of the pupil.  Erythromycin ointment given.    Filed Vitals:   08/25/12 1123  BP: 117/75  Pulse: 96  Temp: 97.5 F (36.4 C)  TempSrc: Oral  Resp: 20  SpO2: 97%     VSS and patient is appropriate for, and amenable to, discharge at this time. Pt verbalized understanding and agrees with care plan. Outpatient follow-up and return precautions given.    Discharge Medication List as of 08/25/2012  2:17 PM    START taking these medications   Details  traMADol (ULTRAM) 50 MG tablet Take 1 tablet (50 mg total) by mouth every 6 (six) hours as needed for pain., Starting 08/25/2012, Until Discontinued, [employer], PA-C 08/25/12 1525

## 2012-08-25 NOTE — ED Notes (Signed)
Pt c/o right eye injury from heel of shoe; redness noted

## 2012-08-25 NOTE — ED Provider Notes (Signed)
Medical screening examination/treatment/procedure(s) were performed by non-physician practitioner and as supervising physician I was immediately available for consultation/collaboration.    Austin Herd R Telesa Jeancharles, MD 08/25/12 1554 

## 2012-10-14 ENCOUNTER — Encounter: Payer: Self-pay | Admitting: Family Medicine

## 2012-10-14 ENCOUNTER — Ambulatory Visit (INDEPENDENT_AMBULATORY_CARE_PROVIDER_SITE_OTHER): Payer: Self-pay | Admitting: Family Medicine

## 2012-10-14 VITALS — BP 123/84 | HR 81 | Temp 98.5°F | Ht 65.0 in | Wt 227.0 lb

## 2012-10-14 DIAGNOSIS — R300 Dysuria: Secondary | ICD-10-CM

## 2012-10-14 DIAGNOSIS — N912 Amenorrhea, unspecified: Secondary | ICD-10-CM

## 2012-10-14 DIAGNOSIS — R3915 Urgency of urination: Secondary | ICD-10-CM

## 2012-10-14 LAB — POCT URINALYSIS DIPSTICK
Bilirubin, UA: NEGATIVE
Glucose, UA: NEGATIVE
Ketones, UA: NEGATIVE
Leukocytes, UA: NEGATIVE
Nitrite, UA: NEGATIVE
Protein, UA: NEGATIVE
Spec Grav, UA: >=1.03
Urobilinogen, UA: 0.2
pH, UA: 6

## 2012-10-14 LAB — POCT UA - MICROSCOPIC ONLY

## 2012-10-14 LAB — POCT URINE PREGNANCY: Preg Test, Ur: NEGATIVE

## 2012-10-14 NOTE — Progress Notes (Signed)
Subjective:     Patient ID: Sydney Pierce, female   DOB: Aug 30, 1985, 27 y.o.   MRN: [id number]  HPI 27 year old female presents for possible UTI.  1) ? UTI - Patient reports urinary urgency and frequency for approximately 2 weeks. - She denies dysuria. - No exacerbating/releiving factors. - No associated fevers, chills, back pain. - She does report a sense of lower abdominal fullness.   - Last menstrual cycle was in April; She has a history of infertility and anovulation.  Review of Systems Per HPI    Objective:   Physical Exam Filed Vitals:   10/14/12 1523  BP: 123/84  Pulse: 81  Temp: 98.5 F (36.9 C)  Exam: General: well appearing, NAD. Cardiovascular: RRR. No murmurs, rubs, or gallops. Respiratory: CTAB. No rales, rhonchi, or wheeze. Abdomen: obese, soft, nontender, nondistended. No appreciable organomegaly. Extremities: No LE edema. Skin: Hyperpigmented area noted between breasts (patient brought this up and showed affected area to me).  This has been a issue for quite sometime.    Assessment:     See Problem list    Plan:

## 2012-10-14 NOTE — Assessment & Plan Note (Signed)
UA revealed trace blood.  F/U microscopic revealed 1-5 RBC's.   Will obtain urine culture.  Will call patient with results. Patient advised to follow up if she continues to have urgency/frequency as she may need further work up or referral to urology.

## 2012-10-14 NOTE — Patient Instructions (Addendum)
It was nice seeing you today.  We will obtain a urine culture and I will call you with the results.  Following the culture, if you continue to have problems please return for follow up to explore further workup/treatment.

## 2012-10-15 ENCOUNTER — Ambulatory Visit: Payer: Self-pay | Admitting: Family Medicine

## 2012-10-16 LAB — URINE CULTURE: Colony Count: 45000

## 2012-10-17 ENCOUNTER — Telehealth: Payer: Self-pay | Admitting: Family Medicine

## 2012-10-17 NOTE — Telephone Encounter (Signed)
Left detailed message on voicemail.Sydney Pierce  

## 2012-10-17 NOTE — Telephone Encounter (Signed)
LMOM advising pt to rt call. Please advise of below.

## 2012-10-17 NOTE — Telephone Encounter (Signed)
Message copied by Barnie Alderman on Thu Oct 17, 2012 12:24 PM ------      Message from: Tommie Sams      Created: Thu Oct 17, 2012 10:30 AM       Please call and inform patient of negative urine culture.             Thanks            [employer] DO ------

## 2012-12-02 ENCOUNTER — Encounter (HOSPITAL_COMMUNITY): Payer: Self-pay | Admitting: Emergency Medicine

## 2012-12-02 ENCOUNTER — Emergency Department (HOSPITAL_COMMUNITY): Payer: Self-pay

## 2012-12-02 ENCOUNTER — Emergency Department (HOSPITAL_COMMUNITY)
Admission: EM | Admit: 2012-12-02 | Discharge: 2012-12-02 | Disposition: A | Discharge status: Home / Self Care | Payer: Self-pay | Attending: Emergency Medicine | Admitting: Emergency Medicine

## 2012-12-02 DIAGNOSIS — X503XXA Overexertion from repetitive movements, initial encounter: Secondary | ICD-10-CM | POA: Insufficient documentation

## 2012-12-02 DIAGNOSIS — Y9210 Unspecified residential institution as the place of occurrence of the external cause: Secondary | ICD-10-CM | POA: Insufficient documentation

## 2012-12-02 DIAGNOSIS — S4980XA Other specified injuries of shoulder and upper arm, unspecified arm, initial encounter: Secondary | ICD-10-CM | POA: Insufficient documentation

## 2012-12-02 DIAGNOSIS — IMO0001 Reserved for inherently not codable concepts without codable children: Secondary | ICD-10-CM | POA: Insufficient documentation

## 2012-12-02 DIAGNOSIS — R5381 Other malaise: Secondary | ICD-10-CM | POA: Insufficient documentation

## 2012-12-02 DIAGNOSIS — R209 Unspecified disturbances of skin sensation: Secondary | ICD-10-CM | POA: Insufficient documentation

## 2012-12-02 DIAGNOSIS — S46909A Unspecified injury of unspecified muscle, fascia and tendon at shoulder and upper arm level, unspecified arm, initial encounter: Secondary | ICD-10-CM | POA: Insufficient documentation

## 2012-12-02 DIAGNOSIS — Z79899 Other long term (current) drug therapy: Secondary | ICD-10-CM | POA: Insufficient documentation

## 2012-12-02 DIAGNOSIS — R51 Headache: Secondary | ICD-10-CM | POA: Insufficient documentation

## 2012-12-02 DIAGNOSIS — Z3202 Encounter for pregnancy test, result negative: Secondary | ICD-10-CM | POA: Insufficient documentation

## 2012-12-02 DIAGNOSIS — Y93F9 Activity, other caregiving: Secondary | ICD-10-CM | POA: Insufficient documentation

## 2012-12-02 DIAGNOSIS — M791 Myalgia, unspecified site: Secondary | ICD-10-CM

## 2012-12-02 LAB — COMPREHENSIVE METABOLIC PANEL
ALT: 32 U/L (ref 0–35)
Alkaline Phosphatase: 119 U/L — ABNORMAL HIGH (ref 39–117)
CO2: 28 mEq/L (ref 19–32)
GFR calc Af Amer: >90 mL/min (ref >90)
GFR calc non Af Amer: >90 mL/min (ref >90)
Glucose, Bld: 127 mg/dL — ABNORMAL HIGH (ref 70–99)
Potassium: 4.2 mEq/L (ref 3.5–5.1)
Sodium: 140 mEq/L (ref 135–145)

## 2012-12-02 LAB — CBC
Platelets: 276 10*3/uL (ref 150–400)
RBC: 4.4 MIL/uL (ref 3.87–5.11)
WBC: 9.2 10*3/uL (ref 4.0–10.5)

## 2012-12-02 LAB — POCT PREGNANCY, URINE: Preg Test, Ur: NEGATIVE

## 2012-12-02 LAB — DIFFERENTIAL
Lymphocytes Relative: 36 % (ref 12–46)
Lymphs Abs: 3.3 10*3/uL (ref 0.7–4.0)
Neutro Abs: 5.2 10*3/uL (ref 1.7–7.7)
Neutrophils Relative %: 56 % (ref 43–77)

## 2012-12-02 LAB — PROTIME-INR
INR: 0.98 (ref 0.00–1.49)
Prothrombin Time: 12.8 seconds (ref 11.6–15.2)

## 2012-12-02 MED ORDER — IBUPROFEN 800 MG PO TABS: 800.0000 mg | ORAL_TABLET | Freq: Three times a day (TID) | ORAL | Status: DC

## 2012-12-02 MED ORDER — HYDROCODONE-ACETAMINOPHEN 5-325 MG PO TABS: 2.0000 | ORAL_TABLET | ORAL | Status: DC | PRN

## 2012-12-02 MED ORDER — METHOCARBAMOL 500 MG PO TABS: 500.0000 mg | ORAL_TABLET | Freq: Two times a day (BID) | ORAL | Status: DC

## 2012-12-02 NOTE — ED Notes (Signed)
Patient reports that numbness to left arm "happened again" while in [place] but has since resolved again. Patient just wanted to let staff know this. Patient AAOx4, resp e/u, NAD. Sitting in waiting room with family.

## 2012-12-02 NOTE — ED Provider Notes (Signed)
CSN: [id number]     Arrival date & time 12/02/12  1804 History     First MD Initiated Contact with Patient 12/02/12 2107     Chief Complaint  Patient presents with  . Numbness  . Arm Pain    HPI   Patient works as a Lawyer. She states that 4 days ago started noticing that her hand was sore and uncomfortable as well.  Ultimately her shoulder became sore. She feels some discomfort at the base of her neck "like wo golf balls". No headache no weakness She has not been any unusual lifting or awkward positions. However does do repetitive motion work.   She has no dyspnea she's had no cough no fevers no falls no injuries  Past Medical History  Diagnosis Date  . Infertility associated with anovulation   . PCOS (polycystic ovarian syndrome)   . Oligomenorrhea   . No pertinent past medical history   . Infertility associated with anovulation    Past Surgical History  Procedure Laterality Date  . No past surgeries     Family History  Problem Relation Age of Onset  . Diabetes Father   . Hyperlipidemia Father   . Heart disease Father   . Hypertension Father   . Kidney disease Father    History  Substance Use Topics  . Smoking status: Never Smoker   . Smokeless tobacco: Not on file  . Alcohol Use: Yes     Comment: 4 beers/year   OB History   Grav Para Term Preterm Abortions TAB SAB Ect Mult Living   0              Review of Systems  Constitutional: Negative for fever, chills, diaphoresis, appetite change and fatigue.  HENT: Negative for sore throat, mouth sores and trouble swallowing.   Eyes: Negative for visual disturbance.  Respiratory: Negative for cough, chest tightness, shortness of breath and wheezing.   Cardiovascular: Negative for chest pain.  Gastrointestinal: Negative for nausea, vomiting, abdominal pain, diarrhea and abdominal distention.  Endocrine: Negative for polydipsia, polyphagia and polyuria.  Genitourinary: Negative for dysuria, frequency and hematuria.   Musculoskeletal: Positive for myalgias and arthralgias. Negative for gait problem.  Skin: Negative for color change, pallor and rash.  Neurological: Positive for weakness and headaches. Negative for dizziness, syncope and light-headedness.  Hematological: Does not bruise/bleed easily.  Psychiatric/Behavioral: Negative for behavioral problems and confusion.    Allergies  Review of patient's allergies indicates no known allergies.  Home Medications   Current Outpatient Rx  Name  Route  Sig  Dispense  Refill  . phentermine (ADIPEX-P) 37.5 MG tablet   Oral   Take 37.5 mg by mouth daily before breakfast.         . HYDROcodone-acetaminophen (NORCO/VICODIN) 5-325 MG per tablet   Oral   Take 2 tablets by mouth every 4 (four) hours as needed for pain.   10 tablet   0   . ibuprofen (ADVIL,MOTRIN) 800 MG tablet   Oral   Take 1 tablet (800 mg total) by mouth 3 (three) times daily.   21 tablet   0   . methocarbamol (ROBAXIN) 500 MG tablet   Oral   Take 1 tablet (500 mg total) by mouth 2 (two) times daily.   20 tablet   0    BP 125/62  Pulse 97  Temp(Src) 98.3 F (36.8 C) (Oral)  Resp 20  SpO2 95%  LMP 11/25/2012 Physical Exam  Constitutional: She is oriented to person,  place, and time. She appears well-developed and well-nourished. No distress.  HENT:  Head: Normocephalic.  Eyes: Conjunctivae are normal. Pupils are equal, round, and reactive to light. No scleral icterus.  Neck: Normal range of motion. Neck supple. No thyromegaly present.  Cardiovascular: Normal rate and regular rhythm.  Exam reveals no gallop and no friction rub.   No murmur heard. Pulmonary/Chest: Effort normal and breath sounds normal. No respiratory distress. She has no wheezes. She has no rales.  Abdominal: Soft. Bowel sounds are normal. She exhibits no distension. There is no tenderness. There is no rebound.  Musculoskeletal: Normal range of motion.  She has no weakness demonstrable left versus  right of the upper extremities. She has normal shoulder elevation is not tender over the rotator cuff. Normal strength to flex and extend the elbow normal grip strength normal flexion extension at the wrist.  Neurological: She is alert and oriented to person, place, and time.  She does have weakness demonstrable left versus right her DTRs are intact and symmetric her cranial nerves are intact and symmetric  Skin: Skin is warm and dry. No rash noted.  Psychiatric: She has a normal mood and affect. Her behavior is normal.    ED Course   Procedures (including critical care time)  Labs Reviewed  COMPREHENSIVE METABOLIC PANEL - Abnormal; Notable for the following:    Glucose, Bld 127 (*)    Alkaline Phosphatase 119 (*)    Total Bilirubin 0.1 (*)    All other components within normal limits  GLUCOSE, CAPILLARY - Abnormal; Notable for the following:    Glucose-Capillary 136 (*)    All other components within normal limits  PROTIME-INR  APTT  CBC  DIFFERENTIAL  TROPONIN I  POCT PREGNANCY, URINE  POCT I-STAT TROPONIN I   Ct Head (brain) Wo Contrast  12/02/2012   *RADIOLOGY REPORT*  Clinical Data: Left upper extremity numbness and intermittent weakness  CT HEAD WITHOUT CONTRAST  Technique:  Contiguous axial images were obtained from the base of the skull through the vertex without contrast. Study was obtained within 24 hours of patient arrival at the emergency department.  Comparison: None.  Findings: The ventricles are normal in size and configuration. There is no mass, hemorrhage, extra-axial fluid collection, or midline shift.  There is a small calcification in the superior posterior left temporal lobe, probably representing a small granuloma.  Elsewhere, gray-white compartments are normal.  No evidence of acute infarct.  Bony calvarium appears intact.  Mastoid air cells are clear.  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  Study otherwise  unremarkable.   Original Report Authenticated By: Bretta Bang, M.D.   1. Muscular pain     MDM  Is a young otherwise healthy female. She is normal neurological exam. No cranial nerve deficits. Normal head CT. As a normal EKG normal troponin normal electrolytes. Her pain is reproducible. It is very likely all musculoskeletal pain. 2 we've ruled out subarachnoid hemorrhage CNS tumor MI.  Claudean Kinds, MD 12/02/12 2209

## 2012-12-02 NOTE — ED Notes (Signed)
Pt c/o left hand pain and numbness x 4 days with some weakness at time; grip strength equal at present; pt sts some pain in jaw and feels like tongue is heavy; speech clear and no neuro deficits noted

## 2013-01-27 ENCOUNTER — Telehealth: Payer: Self-pay | Admitting: Family Medicine

## 2013-01-27 NOTE — Telephone Encounter (Signed)
Pt has been hired by [employer].  She needs her immuziation record asap. Please advise

## 2013-01-27 NOTE — Telephone Encounter (Signed)
Done. .Kire Ferg  

## 2013-01-28 ENCOUNTER — Ambulatory Visit: Payer: Self-pay | Admitting: Family Medicine

## 2013-01-28 ENCOUNTER — Ambulatory Visit: Payer: Self-pay

## 2013-03-31 ENCOUNTER — Ambulatory Visit (INDEPENDENT_AMBULATORY_CARE_PROVIDER_SITE_OTHER): Payer: 59 | Admitting: Family Medicine

## 2013-03-31 ENCOUNTER — Encounter: Payer: Self-pay | Admitting: Family Medicine

## 2013-03-31 VITALS — BP 118/67 | HR 78 | Temp 98.8°F | Ht 65.0 in | Wt 222.0 lb

## 2013-03-31 DIAGNOSIS — J309 Allergic rhinitis, unspecified: Secondary | ICD-10-CM

## 2013-03-31 DIAGNOSIS — J302 Other seasonal allergic rhinitis: Secondary | ICD-10-CM

## 2013-03-31 MED ORDER — GUAIFENESIN ER 600 MG PO TB12: 600.0000 mg | ORAL_TABLET | Freq: Two times a day (BID) | ORAL | Status: DC

## 2013-03-31 MED ORDER — FLUTICASONE PROPIONATE 50 MCG/ACT NA SUSP: 2.0000 | Freq: Every day | NASAL | Status: DC

## 2013-03-31 MED ORDER — TRETINOIN 0.01 % EX GEL: Freq: Every day | CUTANEOUS | Status: DC

## 2013-03-31 MED ORDER — MOMETASONE FUROATE 50 MCG/ACT NA SUSP: 2.0000 | Freq: Every day | NASAL | Status: DC

## 2013-03-31 NOTE — Progress Notes (Signed)
   Subjective:    Patient ID: Sydney Pierce, female    DOB: November 06, 1985, 27 y.o.   MRN: [id number]  HPI  Seasonal allergies: Can not sleep at night due to itchy dry throat and congestion. She has experienced some mild nose bleeds. Eyes burning and itchy eyes and swollen in the morning. No fevers or cough. She has had a history of allergies in the past, but took OTC. She states smelling Clorox makes her nose run. She has tried Claritin, zyrtec and allegra, sometimes she is reverting to taking twice a day instead on once a day. She started using OTC eye drops, but it is not helping with the itchiness.    Review of Systems Negative, with the exception of above mentioned in HPI  Objective:   Physical Exam Gen: NAD. Congested HEENT: AT. [place]. Bilateral TM visualized and have allergy changes. Bilateral eyes without injections or icterus. MMM. Bilateral nares pale, with punctate blood vessels. Throat without erythema or exudates.  CV: RRR  Chest: CTAB, no wheeze or crackles Abd: Soft. NTND. BS present. no Masses palpated.

## 2013-03-31 NOTE — Assessment & Plan Note (Signed)
Patient with seasonal allergies/allergic rhinitis today. Have called in nasal steroid and Mucinex today. Encouraged her to continue either Zyrtec or Allegra once a day Patient to followup with PCP in 2-4 weeks

## 2013-03-31 NOTE — Patient Instructions (Signed)
Allergies °Allergies may happen from anything your body is sensitive to. This may be food, medicines, pollens, chemicals, and nearly anything around you in everyday life that produces allergens. An allergen is anything that causes an allergy producing substance. Heredity is often a factor in causing these problems. This means you may have some of the same allergies as your parents. °Food allergies happen in all age groups. Food allergies are some of the most severe and life threatening. Some common food allergies are cow's milk, seafood, eggs, nuts, wheat, and soybeans. °SYMPTOMS  °· Swelling around the mouth. °· An itchy red rash or hives. °· Vomiting or diarrhea. °· Difficulty breathing. °SEVERE ALLERGIC REACTIONS ARE LIFE-THREATENING. °This reaction is called anaphylaxis. It can cause the mouth and throat to swell and cause difficulty with breathing and swallowing. In severe reactions only a trace amount of food (for example, peanut oil in a salad) may cause death within seconds. °Seasonal allergies occur in all age groups. These are seasonal because they usually occur during the same season every year. They may be a reaction to molds, grass pollens, or tree pollens. Other causes of problems are house dust mite allergens, pet dander, and mold spores. The symptoms often consist of nasal congestion, a runny itchy nose associated with sneezing, and tearing itchy eyes. There is often an associated itching of the mouth and ears. The problems happen when you come in contact with pollens and other allergens. Allergens are the particles in the air that the body reacts to with an allergic reaction. This causes you to release allergic antibodies. Through a chain of events, these eventually cause you to release histamine into the blood stream. Although it is meant to be protective to the body, it is this release that causes your discomfort. This is why you were given anti-histamines to feel better.  If you are unable to  pinpoint the offending allergen, it may be determined by skin or blood testing. Allergies cannot be cured but can be controlled with medicine. °Hay fever is a collection of all or some of the seasonal allergy problems. It may often be treated with simple over-the-counter medicine such as diphenhydramine. Take medicine as directed. Do not drink alcohol or drive while taking this medicine. Check with your caregiver or package insert for child dosages. °If these medicines are not effective, there are many new medicines your caregiver can prescribe. Stronger medicine such as nasal spray, eye drops, and corticosteroids may be used if the first things you try do not work well. Other treatments such as immunotherapy or desensitizing injections can be used if all else fails. Follow up with your caregiver if problems continue. These seasonal allergies are usually not life threatening. They are generally more of a nuisance that can often be handled using medicine. °HOME CARE INSTRUCTIONS  °· If unsure what causes a reaction, keep a diary of foods eaten and symptoms that follow. Avoid foods that cause reactions. °· If hives or rash are present: °· Take medicine as directed. °· You may use an over-the-counter antihistamine (diphenhydramine) for hives and itching as needed. °· Apply cold compresses (cloths) to the skin or take baths in cool water. Avoid hot baths or showers. Heat will make a rash and itching worse. °· If you are severely allergic: °· Following a treatment for a severe reaction, hospitalization is often required for closer follow-up. °· Wear a medic-alert bracelet or necklace stating the allergy. °· You and your family must learn how to give adrenaline or use   an anaphylaxis kit. °· If you have had a severe reaction, always carry your anaphylaxis kit or EpiPen® with you. Use this medicine as directed by your caregiver if a severe reaction is occurring. Failure to do so could have a fatal outcome. °SEEK MEDICAL  CARE IF: °· You suspect a food allergy. Symptoms generally happen within 30 minutes of eating a food. °· Your symptoms have not gone away within 2 days or are getting worse. °· You develop new symptoms. °· You want to retest yourself or your child with a food or drink you think causes an allergic reaction. Never do this if an anaphylactic reaction to that food or drink has happened before. Only do this under the care of a caregiver. °SEEK IMMEDIATE MEDICAL CARE IF:  °· You have difficulty breathing, are wheezing, or have a tight feeling in your chest or throat. °· You have a swollen mouth, or you have hives, swelling, or itching all over your body. °· You have had a severe reaction that has responded to your anaphylaxis kit or an EpiPen®. These reactions may return when the medicine has worn off. These reactions should be considered life threatening. °MAKE SURE YOU:  °· Understand these instructions. °· Will watch your condition. °· Will get help right away if you are not doing well or get worse. °Document Released: 07/04/2002 Document Revised: 08/05/2012 Document Reviewed: 12/09/2007 °[employer]® Patient Information ©2014 [employer]. ° °

## 2013-04-10 ENCOUNTER — Telehealth: Payer: Self-pay | Admitting: *Deleted

## 2013-04-10 ENCOUNTER — Ambulatory Visit (INDEPENDENT_AMBULATORY_CARE_PROVIDER_SITE_OTHER): Payer: 59 | Admitting: Family Medicine

## 2013-04-10 DIAGNOSIS — N912 Amenorrhea, unspecified: Secondary | ICD-10-CM

## 2013-04-10 DIAGNOSIS — R079 Chest pain, unspecified: Secondary | ICD-10-CM | POA: Insufficient documentation

## 2013-04-10 LAB — POCT URINE PREGNANCY: Preg Test, Ur: NEGATIVE

## 2013-04-10 MED ORDER — NAPROXEN 500 MG PO TABS: 500.0000 mg | ORAL_TABLET | Freq: Two times a day (BID) | ORAL | Status: DC

## 2013-04-10 NOTE — Telephone Encounter (Signed)
  Pt seen by Dr. Ermalinda Memos today.  While here, pt stated Retin-A Rx prescribed by Dr. Claiborne Billings needs a Prior Authorization per [facility].  I called Outpt pharm and they are faxing [place] the prior authorization. Pt notified.  Will fwd to Dr. Claiborne Billings for FYI.   Leomar Westberg, Darlyne Russian, CMA

## 2013-04-10 NOTE — Patient Instructions (Signed)
It was great to meet you!  I think you have musculoskeletal pain, Use ice for 20 minutes every 2 hours as needed.   Take the naprosyn every 12 hours for the next 5 days.

## 2013-04-10 NOTE — Progress Notes (Signed)
Patient ID: Sydney Pierce, female   DOB: 1986-04-12, 27 y.o.   MRN: [id number]  Kevin Fenton, MD Phone: [phone]  Subjective:  Chief complaint-noted  # SDA for back and chest pain  She states that her back and chest pain started at approximately 11 PM last night. She states that the left-sided sharp chest pain radiates around her eczema to her mid back. It hurts worse when she lifts her arm in a very particular way. She notes slight dyspnea, sore throat, and dry nose. She denies any aggravation with deep inspiration or cough. She notes that yesterday she picked up her nephew several times to play with him and to swing him. She also notes that she stood out in the cold for about 2 hours yesterday after she got hot standing in the kitchen. She is worried that she may have caught cold because of this.  She denies OCP use, long trips, edema in her legs, or palpitations/tachycardia. She denies any history of DVT or PE. 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  ROS-  Per history of present illness plus No fever, chills, sweats No nausea, vomiting, diarrhea No edema   Past Medical History Patient Active Problem List   Diagnosis Date Noted  . Chest pain 04/10/2013  . Urinary urgency 10/14/2012  . TB lung, latent 10/16/2011  . 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 Seasonal allergies 08/14/2011  . Infertility associated with anovulation 06/23/2011  . Abnormal vaginal bleeding 11/25/2010  . AMENORRHEA 08/10/2009  . LIBIDO, DECREASED 03/10/2009  . ACANTHOSIS NIGRICANS 08/03/2008  . DYSPAREUNIA 02/26/2007  . OBESITY NOS 08/15/2006    Medications- reviewed and updated Current Outpatient Prescriptions  Medication Sig Dispense Refill  . fluticasone (FLONASE) 50 MCG/ACT nasal spray Place 2 sprays into both nostrils daily.   16 g  3  . guaiFENesin (MUCINEX) 600 MG 12 hr tablet Take 1 tablet (600 mg total) by mouth 2 (two) times daily.  60 tablet  3  . HYDROcodone-acetaminophen (NORCO/VICODIN) 5-325 MG per tablet Take 2 tablets by mouth every 4 (four) hours as needed for pain.  10 tablet  0  . ibuprofen (ADVIL,MOTRIN) 800 MG tablet Take 1 tablet (800 mg total) by mouth 3 (three) times daily.  21 tablet  0  . methocarbamol (ROBAXIN) 500 MG tablet Take 1 tablet (500 mg total) by mouth 2 (two) times daily.  20 tablet  0  . naproxen (NAPROSYN) 500 MG tablet Take 1 tablet (500 mg total) by mouth 2 (two) times daily with a meal.  30 tablet  0  . phentermine (ADIPEX-P) 37.5 MG tablet Take 37.5 mg by mouth daily before breakfast.      . tretinoin (RETIN-A) 0.01 % gel Apply topically at bedtime.  45 g  0   No current facility-administered medications for this visit.    Objective: There were no vitals taken for this visit. Gen: NAD, alert, cooperative with exam, comfortable HEENT: NCAT, EOMI, PERRL CV: RRR, good S1/S2, no murmur Chest wall: Reproducible chest pain to palpation under left axilla in a small area over one of her ribs. Resp: CTABL, no wheezes, non-labored Ext: No edema, warm Neuro: Alert and oriented, No gross deficits   Assessment/Plan:  Chest pain Conically this is consistent with musculoskeletal pain. Started her on NSAIDs scheduled for 5 days. Also recommended ice for  20 minutes every 2 hours as needed for pain. Consider PE, however as well as a low likelihood and she is completely negative on the Perc score (except for O2 saturation which was not checked, she was not clinically dyspneic.) Discussed red flags at length, advised followup with her PCP as previously planned. Upreg negative, checked as she sexually active and not on any contraceptive and I'm scheduling NSAIDs.    Orders Placed This Encounter  Procedures  . POCT urine pregnancy    Meds ordered this encounter  Medications  .  naproxen (NAPROSYN) 500 MG tablet    Sig: Take 1 tablet (500 mg total) by mouth 2 (two) times daily with a meal.    Dispense:  30 tablet    Refill:  0

## 2013-04-10 NOTE — Assessment & Plan Note (Addendum)
Conically this is consistent with musculoskeletal pain. Started her on NSAIDs scheduled for 5 days. Also recommended ice for 20 minutes every 2 hours as needed for pain. Consider PE, however as well as a low likelihood and she is completely negative on the Perc score (except for O2 saturation which was not checked, she was not clinically dyspneic.) Discussed red flags at length, advised followup with her PCP as previously planned. Upreg negative, checked as she sexually active and not on any contraceptive and I'm scheduling NSAIDs.

## 2013-04-11 NOTE — Telephone Encounter (Signed)
This has been discussed in length. She needs to talk to her PCP for this matter. Her PCP is Dr. Mauricio Po. I simply was kind enough to attempt a refill until she could get back to see Dr. Mauricio Po. She was a same day appointment and I never saw her for the problem this medication is associated with, thus I do not what they have attempted in the past etc... She was suppose to follow with Dr. Mauricio Po. Thanks.

## 2013-04-14 NOTE — Telephone Encounter (Signed)
Left message on patient's voicemail stating she needs an OV with her PCP only to discuss her facial cream and the need for a prior authorization.  Dr. Claiborne Billings who say patient for a same day only appt just kindly refilled this medication for patient.  She NEEDS to discuss her options with her PCP.  This was left on identifiable voice mail.  Kraven Calk, Darlyne Russian, CMA

## 2013-05-20 ENCOUNTER — Ambulatory Visit (INDEPENDENT_AMBULATORY_CARE_PROVIDER_SITE_OTHER): Payer: 59 | Admitting: Family Medicine

## 2013-05-20 ENCOUNTER — Encounter: Payer: Self-pay | Admitting: Family Medicine

## 2013-05-20 ENCOUNTER — Other Ambulatory Visit (HOSPITAL_COMMUNITY)
Admission: RE | Admit: 2013-05-20 | Discharge: 2013-05-20 | Disposition: A | Discharge status: Home / Self Care | Payer: 59 | Source: Ambulatory Visit | Attending: Family Medicine | Admitting: Family Medicine

## 2013-05-20 VITALS — BP 114/70 | HR 88 | Ht 65.0 in | Wt 223.0 lb

## 2013-05-20 DIAGNOSIS — Z01419 Encounter for gynecological examination (general) (routine) without abnormal findings: Secondary | ICD-10-CM | POA: Insufficient documentation

## 2013-05-20 DIAGNOSIS — Z124 Encounter for screening for malignant neoplasm of cervix: Secondary | ICD-10-CM

## 2013-05-20 DIAGNOSIS — L918 Other hypertrophic disorders of the skin: Secondary | ICD-10-CM

## 2013-05-20 DIAGNOSIS — L908 Other atrophic disorders of skin: Secondary | ICD-10-CM

## 2013-05-20 DIAGNOSIS — L83 Acanthosis nigricans: Secondary | ICD-10-CM

## 2013-05-20 DIAGNOSIS — E8881 Metabolic syndrome: Secondary | ICD-10-CM

## 2013-05-20 DIAGNOSIS — E669 Obesity, unspecified: Secondary | ICD-10-CM

## 2013-05-20 LAB — COMPREHENSIVE METABOLIC PANEL
ALT: 20 U/L (ref 0–35)
AST: 21 U/L (ref 0–37)
Albumin: 4.3 g/dL (ref 3.5–5.2)
Alkaline Phosphatase: 104 U/L (ref 39–117)
BUN: 11 mg/dL (ref 6–23)
CALCIUM: 9.2 mg/dL (ref 8.4–10.5)
CHLORIDE: 101 meq/L (ref 96–112)
CO2: 31 meq/L (ref 19–32)
CREATININE: 0.63 mg/dL (ref 0.50–1.10)
GLUCOSE: 138 mg/dL — AB (ref 70–99)
Potassium: 3.9 mEq/L (ref 3.5–5.3)
Sodium: 136 mEq/L (ref 135–145)
TOTAL PROTEIN: 7.7 g/dL (ref 6.0–8.3)
Total Bilirubin: 0.3 mg/dL (ref 0.3–1.2)

## 2013-05-20 LAB — LDL CHOLESTEROL, DIRECT: LDL DIRECT: 87 mg/dL

## 2013-05-20 LAB — POCT GLYCOSYLATED HEMOGLOBIN (HGB A1C): Hemoglobin A1C: 6.5

## 2013-05-20 MED ORDER — METFORMIN HCL 500 MG PO TABS: 500.0000 mg | ORAL_TABLET | Freq: Two times a day (BID) | ORAL | Status: DC

## 2013-05-20 NOTE — Patient Instructions (Signed)
It was a pleasure to see you today.  We did your pap smear today; I will contact you with the results when available.   Regarding the insulin resistance and anovulation (lack of regular periods), I sent a prescription for Metformin 500mg  to [facility].  Take 1 tablet in the morning for the first week, then 1 tablet twice daily thereafter.   I would like to see you back in 3 months, or sooner if needed.  Please keep track of the dates of your menses. Keep taking the prenatal vitamins daily.  I am glad to hear you have made positive changes in your diet and activity!

## 2013-05-21 ENCOUNTER — Encounter: Payer: Self-pay | Admitting: Family Medicine

## 2013-05-21 NOTE — Assessment & Plan Note (Signed)
Still present; check A1C again, beginning Metformin.

## 2013-05-21 NOTE — Assessment & Plan Note (Signed)
Had been given Retin-A for this in the past, asks for refills.  I have explained that retinoids (even topical) not advisable if she is going to attempt to become pregnant.  Will defer this for now.

## 2013-05-21 NOTE — Assessment & Plan Note (Signed)
Evidence of insulin resistance on previous A1C, also with physical findings to support diagnosis of PCOS (anovulation, hirsutism/hyperandrogenism). To check A1C again today, start metformin today.  Discussed with Sydney Pierce that this may aid in her ability to ovulate and thus become pregnant.

## 2013-05-21 NOTE — Progress Notes (Signed)
   Subjective:    Patient ID: Sydney Pierce, female    DOB: 1985/04/25, 28 y.o.   MRN: [id number]  HPI Comes in today for PAP smear, and to review recent efforts to become pregnant.  Has followed with Dr Reynaldo MiniumJuan Fernandez for infertility in the past, has been on Clomid and dexamethasone, hCG injections, which have not been successful.  She has attempted weight loss with phentermine which she has obtained from [facility] [place], [place]; not taking now.  Occasionally gets metformin from the same clinic, has taken 250mg /day sporadically.  Has been prescribed metformin by me in the past, however did not take this regularly.  In the past 2 weeks she has attempted to lose weight with physical activity and diet changes, and has managed to lose 3 lbs in this time.  LMP Jan 18th, previous menses to that was 2 months prior.   Has had skin lesion between breasts that has resolved in the past with Retin-A topical gel.  Asking for refill of this. Discussed use of retinoids when seeking pregnancy.   Cervical Cancer Screening: never had abnormal PAP.  She has never had STI.  No vaginal discharge or pelvic pain at this time.    Review of Systems No fevers or chills, no dysuria, no cough, no shortness of breath, no chest pain. No nausea/vomiting, no diarrhea.  Does report hirsutism on face.    Objective:   Physical Exam Generally well appearing, no apparent distress HEENT Neck supple. Marked acanthosis nigricans on neck.  Hyperpigmented lesions (resembling AK) on skin between breasts. Clear oropharynx. MMM.  COR Regular S1S2 PULM Clear bilaterally, no rales or wheezes ABD Obese, nontender.  GYN: Normal vaginal mucosa; healthy-appearing nonfriable cervix. No discharge from os. Bimanual without adnexal masses or CMT.        Assessment & Plan:

## 2013-05-21 NOTE — Assessment & Plan Note (Signed)
Discussed weight loss strategies, she has taken Phentermine from another practice [facility] [place] and uses this sporadically.  I have explained my concerns about this strategy, she admits that the weight loss she achieves with this medicine is temporary and does not stay off.  She is a couple of weeks into a more comprehensive diet and exercise program and finds this to be quite positive.  Will continue with this approach, which ultimately is better for her ultimate goal of getting pregnant as well.

## 2013-05-22 ENCOUNTER — Telehealth: Payer: Self-pay | Admitting: Family Medicine

## 2013-05-22 NOTE — Telephone Encounter (Signed)
Patient would like lab results from OV 05/20/13.

## 2013-05-22 NOTE — Telephone Encounter (Signed)
Called pt and informed of recent labs. Sydney Pierce.[name], [name]

## 2013-05-22 NOTE — Telephone Encounter (Signed)
Will fwd. To PCP for review. Thanks. .Sydney Pierce  

## 2013-05-28 ENCOUNTER — Encounter: Payer: Self-pay | Admitting: Family Medicine

## 2013-08-08 ENCOUNTER — Ambulatory Visit: Payer: 59 | Admitting: Family Medicine

## 2013-08-08 ENCOUNTER — Telehealth: Payer: Self-pay | Admitting: Family Medicine

## 2013-08-08 DIAGNOSIS — E8881 Metabolic syndrome: Secondary | ICD-10-CM

## 2013-08-08 MED ORDER — METFORMIN HCL 500 MG PO TABS: 500.0000 mg | ORAL_TABLET | Freq: Two times a day (BID) | ORAL | Status: DC

## 2013-08-08 NOTE — Telephone Encounter (Signed)
Medication refilled to [facility].  She may reschedule in the coming 1-2 months as her schedule permits.  JB

## 2013-08-08 NOTE — Telephone Encounter (Signed)
Ms. Sydney Pierce not able to keep appt this am because she have to [facility] [address].  Wanted to ask if she could just get her refill on her Metformin.  Can call cell if need to speak with her.

## 2013-08-08 NOTE — Telephone Encounter (Signed)
Called pt. Informed. Pt agreed. .Sydney Pierce  

## 2013-08-12 ENCOUNTER — Telehealth: Payer: Self-pay | Admitting: *Deleted

## 2013-08-12 NOTE — Telephone Encounter (Signed)
Pt called requesting name of In- Vitro name of MD left on voicemail the name of Dr. April MansonYalcinkaya with #. I told patient she may need a referral to see him. I told her last OV with JF was in April 2013

## 2014-03-11 ENCOUNTER — Other Ambulatory Visit: Payer: Self-pay | Admitting: Emergency Medicine

## 2014-03-12 LAB — VARICELLA ZOSTER ANTIBODY, IGG: Varicella IgG: 2996 Index — ABNORMAL HIGH (ref <135.00)

## 2014-03-12 LAB — MEASLES/MUMPS/RUBELLA IMMUNITY
MUMPS IGG: 224 [AU]/ml — AB (ref <9.00)
Rubella: 21.7 Index — ABNORMAL HIGH (ref <0.90)
Rubeola IgG: 43.4 AU/mL — ABNORMAL HIGH (ref <25.00)

## 2014-05-18 ENCOUNTER — Ambulatory Visit (INDEPENDENT_AMBULATORY_CARE_PROVIDER_SITE_OTHER): Payer: 59 | Admitting: Family Medicine

## 2014-05-18 VITALS — BP 112/78 | HR 78 | Temp 98.4°F | Wt 226.8 lb

## 2014-05-18 DIAGNOSIS — H109 Unspecified conjunctivitis: Secondary | ICD-10-CM

## 2014-05-18 NOTE — Patient Instructions (Signed)
Nice to meet you. You likely have allergic irritation of your eyes. Please use artificial tears 4 times a day and and the stye ointment twice a day.  If this does not get better or you develop any discharge from your eyes please return to care.

## 2014-05-18 NOTE — Progress Notes (Signed)
Patient ID: Sydney Pierce, female   DOB: [birth year], 29 y.o.   MRN: [id number]  Sydney AlarEric Unice Vantassel, MD Phone: [phone]  Carolyne FiscalMariel Guadalupe Randol KernBarraza Sydney Pierce is a 29 y.o. female who presents today for same day appointment.  Eye itching: notes her right eye started itching in the lateral aspect of the eye lid 1 week ago. She notes over the past 2 days her left eyelid has begun to itch in the same distribution. There is mild redness to the eye lid conjunctiva. She does note some mild eye watering. She denies eye discharge, congestion, and sneezing. She has been using ketotifen drops and stye ointment that have not been beneficial. 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 She notes her vision is normal and there is no eye pain. She does have a history of allergies and has had symptoms like this in the past with her allergies, though they usually improve sooner with the eye drops. No crusting in the morning.    ROS: Per HPI   Physical Exam Filed Vitals:   05/18/14 1323  BP: 112/78  Pulse: 78  Temp: 98.4 F (36.9 C)    Gen: Well NAD HEENT: PERRL,  MMM, bilateral normal corneas, mild erythema of the lateral upper and lower conjunctival surface of the eyelids and mild conjunctival erythema of the most lateral portion of the right eye, no follicular pattern seen on conjunctival surface of the eyelids, no cobblestoning noted, no discharge, no eyelash abnormalities noted, visualized large percentage of vessels of conjunctival surface of eyelid, bilateral TMs normal, normal OP Normal fluorescein exam bilateral eyes, no corneal abrasions noted No foreign body seen in either eye    Assessment/Plan: Please see individual problem list.  Sydney AlarEric Kennesha Brewbaker, MD [facility] PGY-3

## 2014-05-18 NOTE — Assessment & Plan Note (Addendum)
Patient with evidence of bilateral mild conjunctivitis. Possibly allergic in nature giving itching sensation and intermittent eye watering with her history of allergic rhinitis, though minimal response to antihistamine drops, or could be related to dry eyes. Unlikely to be a viral conjunctivitis or bacterial conjunctivitis given lack of discharge. Unlikely a chlamydial inclusion conjunctivitis given no follicular pattern to conjuctiva and no discharge. Does not have red flags of eye pain, corneal opacity, or fixed pupils. Also reports normal vision. Unlikely glaucoma, keratitis, or foreign body given lack of red flags and normal fluorescein exam. Discussed using artifical tears and continuing to use her stye lubricant to help with lubrication/moisturizing of eyes. Advised to return to care if this worsens or does not improve over then next 2 days or if she develops discharge.

## 2014-06-01 NOTE — Progress Notes (Signed)
Reviewed resident assessment and plan.  JB 

## 2014-09-30 ENCOUNTER — Encounter: Payer: Self-pay | Admitting: Family Medicine

## 2014-09-30 ENCOUNTER — Ambulatory Visit (INDEPENDENT_AMBULATORY_CARE_PROVIDER_SITE_OTHER): Payer: 59 | Admitting: Family Medicine

## 2014-09-30 VITALS — BP 121/76 | HR 76 | Temp 97.8°F | Wt 219.6 lb

## 2014-09-30 DIAGNOSIS — E8881 Metabolic syndrome: Secondary | ICD-10-CM | POA: Diagnosis not present

## 2014-09-30 DIAGNOSIS — B3731 Acute candidiasis of vulva and vagina: Secondary | ICD-10-CM

## 2014-09-30 DIAGNOSIS — B373 Candidiasis of vulva and vagina: Secondary | ICD-10-CM | POA: Diagnosis not present

## 2014-09-30 DIAGNOSIS — L298 Other pruritus: Secondary | ICD-10-CM | POA: Diagnosis not present

## 2014-09-30 DIAGNOSIS — N898 Other specified noninflammatory disorders of vagina: Secondary | ICD-10-CM | POA: Insufficient documentation

## 2014-09-30 LAB — POCT WET PREP (WET MOUNT): CLUE CELLS WET PREP WHIFF POC: NEGATIVE

## 2014-09-30 MED ORDER — FLUCONAZOLE 150 MG PO TABS: 150.0000 mg | ORAL_TABLET | Freq: Once | ORAL | Status: DC

## 2014-09-30 NOTE — Addendum Note (Signed)
Addended by: Hazeline JunkerGRUNZ, RYAN B on: 09/30/2014 04:50 PM   Modules accepted: Orders

## 2014-09-30 NOTE — Patient Instructions (Signed)
I will call you with results. If you experience worsening of symptoms, fever, abd pain, you need to be evaluated by a medical professional.

## 2014-09-30 NOTE — Progress Notes (Signed)
Subjective: Sydney Pierce is a 29 y.o. female presenting for vaginal itching.   2 weeks of intermittent vaginal itching also involving the vulva made somewhat better by taking diflucan x1 (prescribed by a friend who is a doctor without exam). No pain, dyspareunia, discharge, abnormal bleeding, abd pain, fevers, dysuria. She is in a monogamous relationship with her husband and is attempting to conceive.   PMH: insulin resistance, no STI's, had negative screening recently  Objective: BP 121/76 mmHg  Pulse 76  Temp(Src) 97.8 F (36.6 C) (Oral)  Wt 219 lb 9.6 oz (99.61 kg) Gen: Well appearing, obese 29 y.o. female in no distress Pelvic: External genitalia within normal limits. Vaginal mucosa pink, moist, normal rugae.  Nonfriable cervix without lesions, no discharge or bleeding noted on speculum exam.  No cervical motion tenderness.   Gilberto BetterMichelle Simpson, CMA present throughout duration of exam.    Assessment/Plan: Sydney GeraldsMariel B Ore is a 29 y.o. female here for vulvovaginal pruritus.  See problem list for plan.

## 2014-09-30 NOTE — Assessment & Plan Note (Signed)
Will refill metformin and I asked her to schedule a follow up appointment with me to meet her formally as her new PCP and discuss this and other chronic health issues.

## 2014-09-30 NOTE — Assessment & Plan Note (Signed)
No suspicious findings on exam for yeast nor lichen simplex nor additional clues in history. Will check wet prep for yeast, etc. and go from there. Had STI screening somewhat recently and declines this today.

## 2014-10-15 IMAGING — CT CT HEAD W/O CM
2 series · 16 of 30 positions shown, 20 images · non-contrast
Comparison: None.

CLINICAL DATA: Left upper extremity numbness and intermittent
weakness

CT HEAD WITHOUT CONTRAST
TECHNIQUE: Contiguous axial images were obtained from the base of
the skull through the vertex without contrast. Study was obtained
within 24 hours of patient arrival at the emergency department.

[Series 2: head w/o · axial · non-contrast · 0.49mm/px · z∈[+230,+360]mm · 13 of 32 slices shown, 17 images]
[im 3/32  brain]
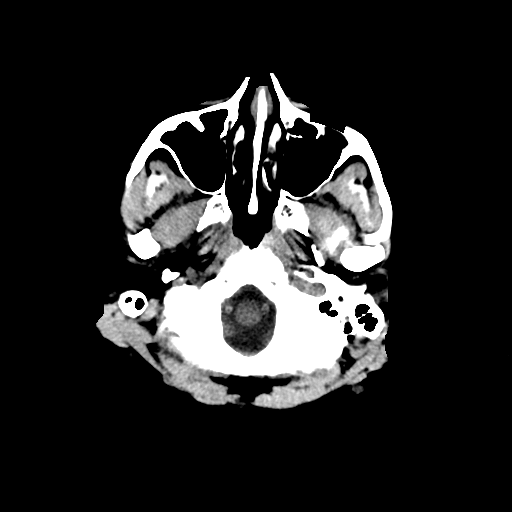
[im 3/32  bone]
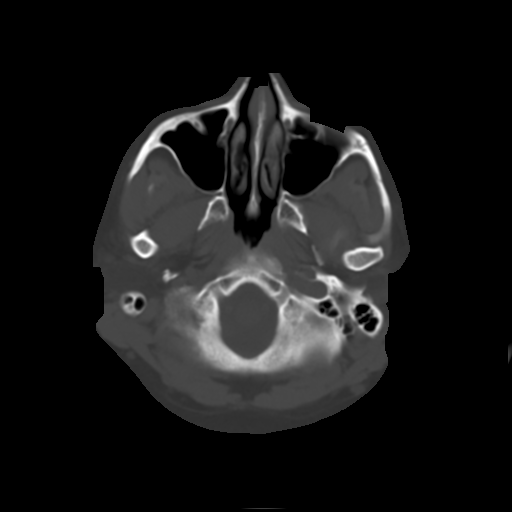
[im 5/32  brain]
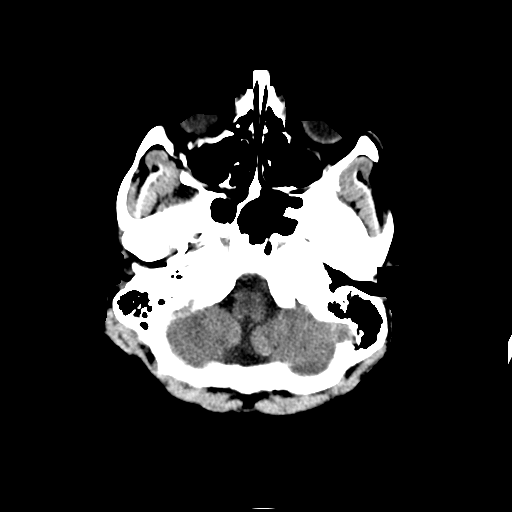
[im 7/32  brain]
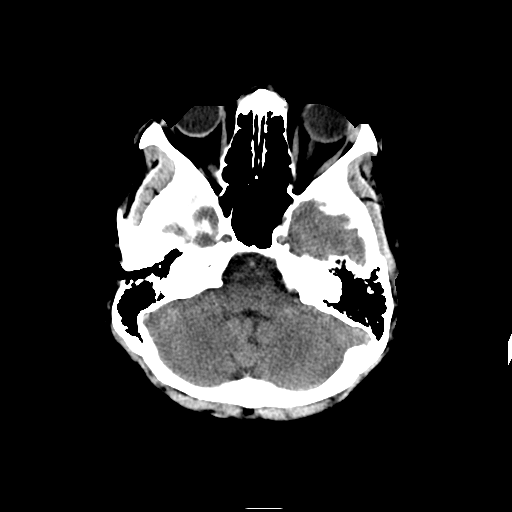
[im 9/32  brain]
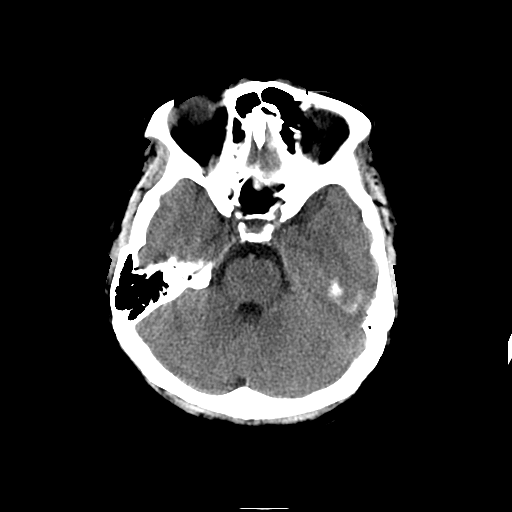
[im 12/32  brain]
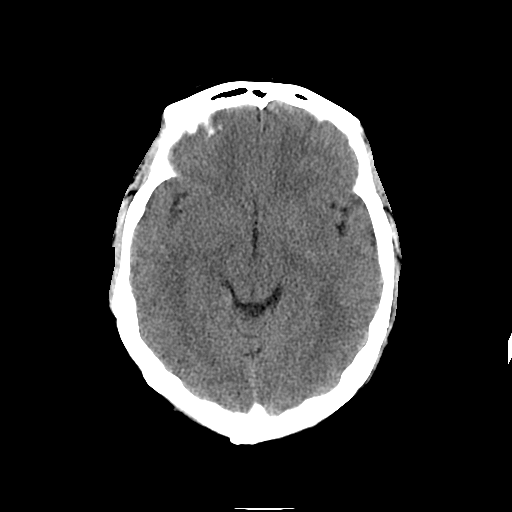
[im 12/32  bone]
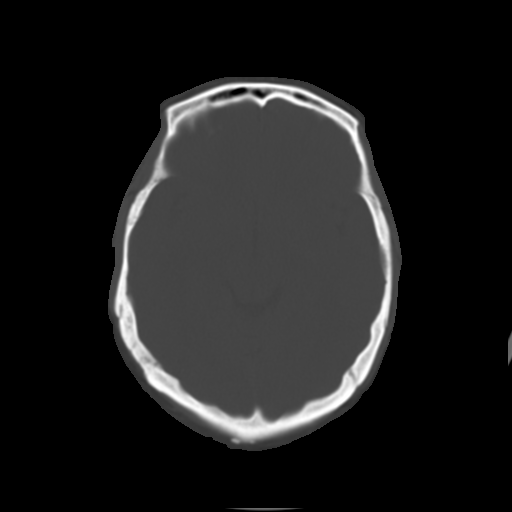
[im 14/32  brain]
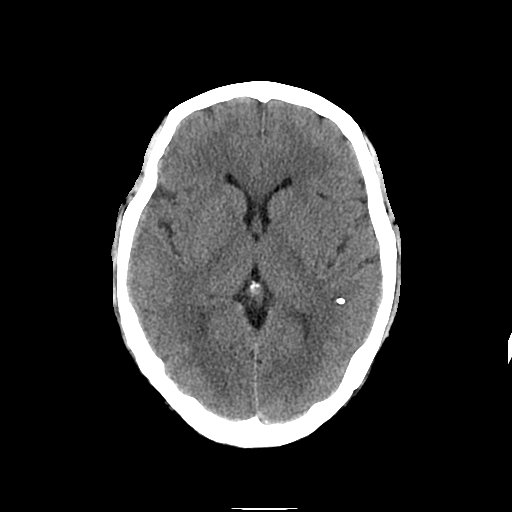
[im 16/32  brain]
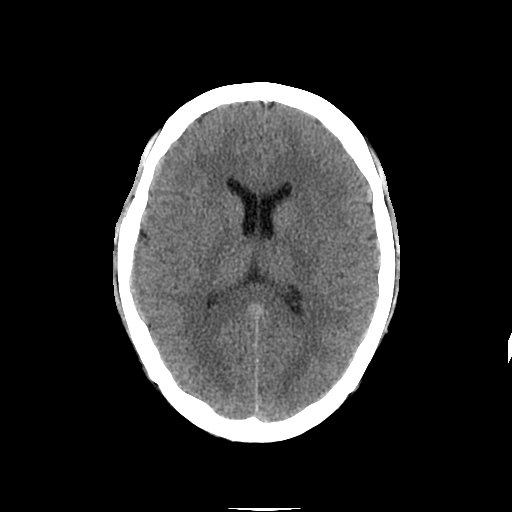
[im 18/32  brain]
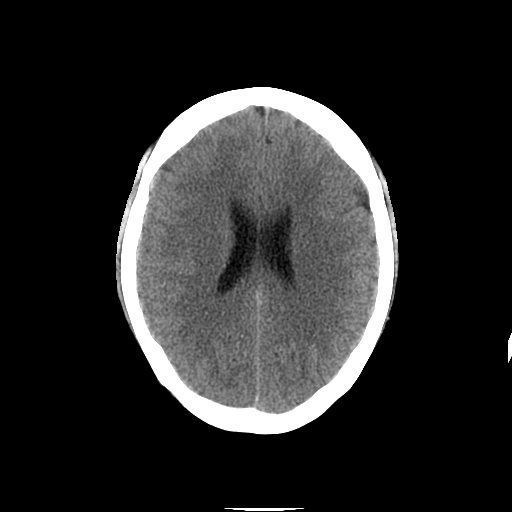
[im 20/32  brain]
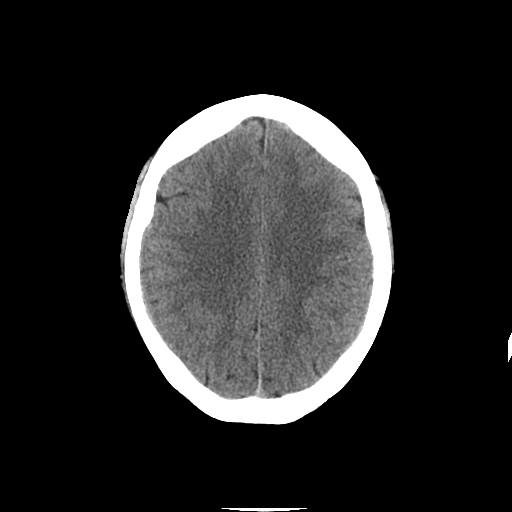
[im 20/32  bone]
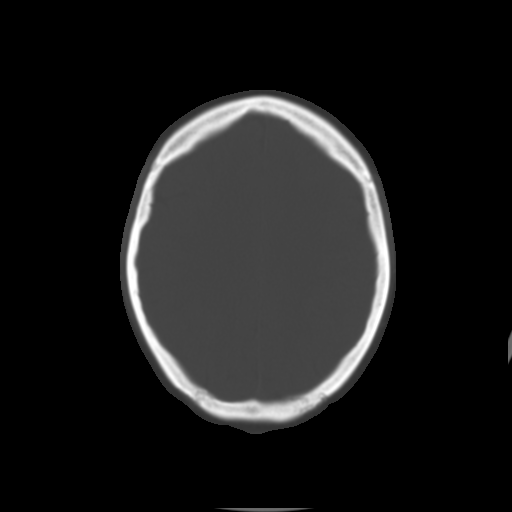
[im 23/32  brain]
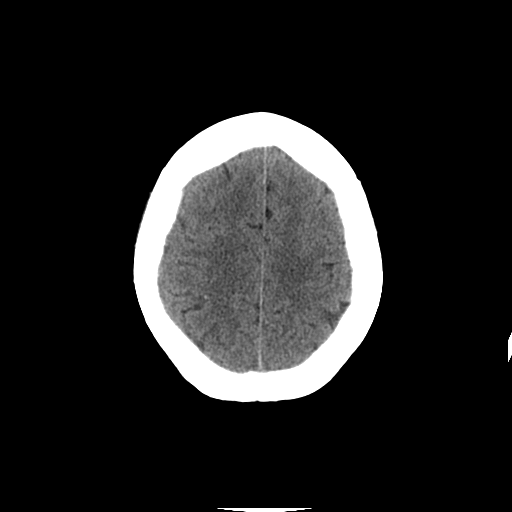
[im 25/32  brain]
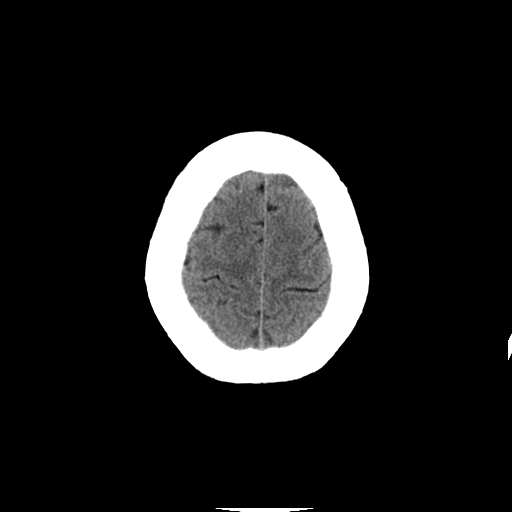
[im 27/32  brain]
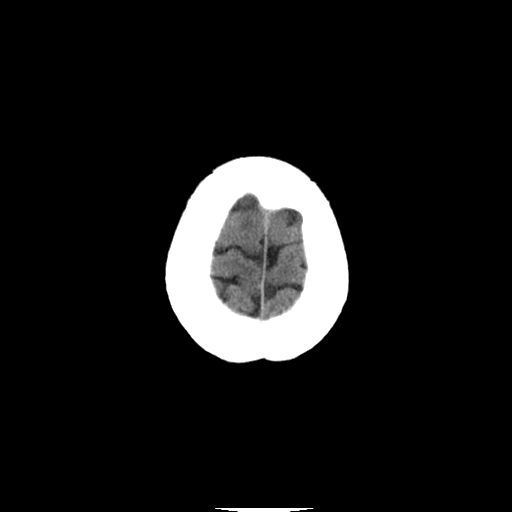
[im 29/32  brain]
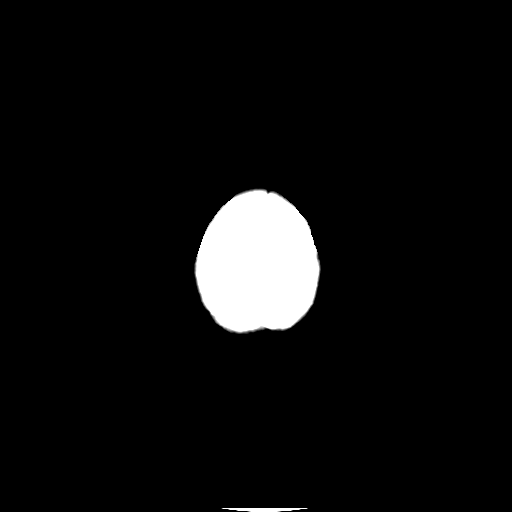
[im 29/32  bone]
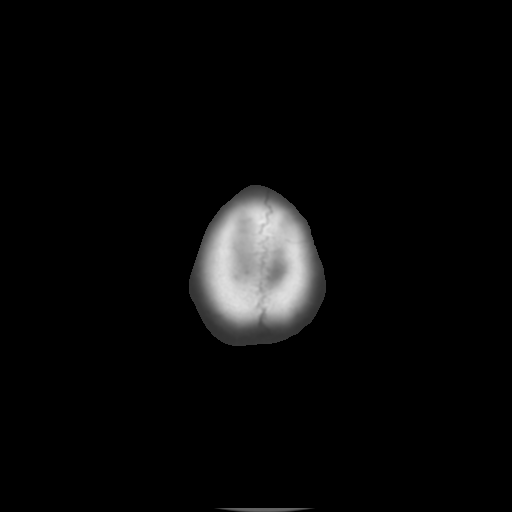

[Series 3: head w/o bone · axial · non-contrast · 0.49mm/px · z∈[+230,+276]mm · 3 of 32 slices shown]
[im 3/32  bone]
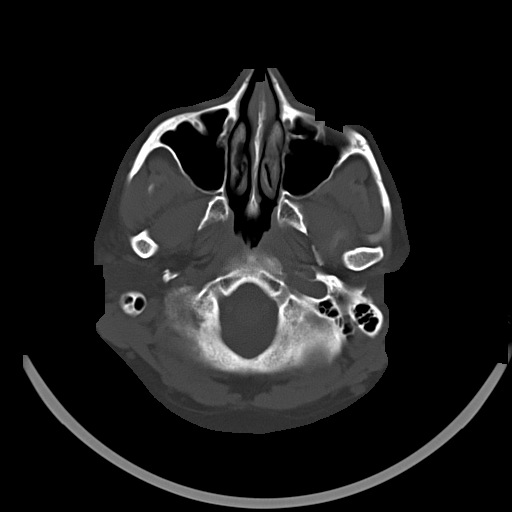
[im 7/32  bone]
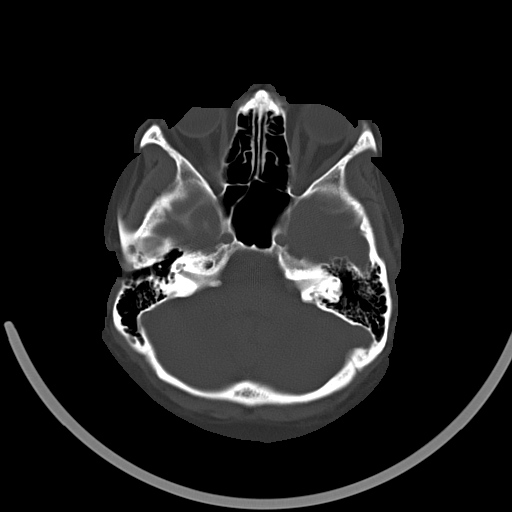
[im 12/32  bone]
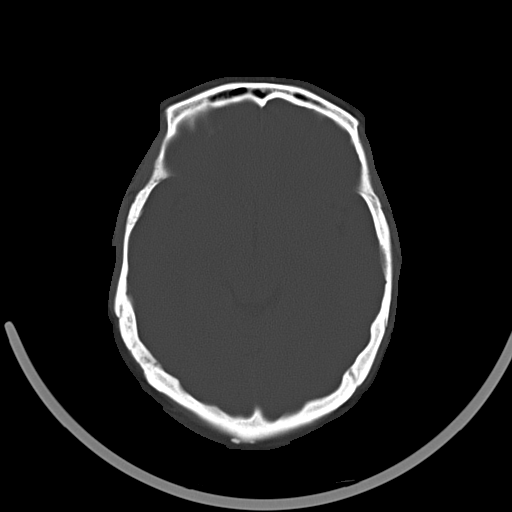

[16 of 30 positions shown; findings below may reference images not displayed]

FINDINGS: The ventricles are normal in size and configuration.
There is no mass, hemorrhage, extra-axial fluid collection, or
midline shift.

There is a small calcification in the superior posterior left
temporal lobe, probably representing a small granuloma.  Elsewhere,
gray-white compartments are normal.  No evidence of acute infarct.

Bony calvarium appears intact.  Mastoid air cells are clear.
IMPRESSION: Presumed small calcified granuloma superior posterior left temporal
lobe.  Study otherwise unremarkable.

## 2014-10-25 ENCOUNTER — Other Ambulatory Visit: Payer: Self-pay | Admitting: Family Medicine

## 2014-10-25 ENCOUNTER — Telehealth: Payer: Self-pay | Admitting: Family Medicine

## 2014-10-25 DIAGNOSIS — B3731 Acute candidiasis of vulva and vagina: Secondary | ICD-10-CM

## 2014-10-25 DIAGNOSIS — B373 Candidiasis of vulva and vagina: Secondary | ICD-10-CM

## 2014-10-25 MED ORDER — FLUCONAZOLE 150 MG PO TABS: 150.0000 mg | ORAL_TABLET | Freq: Once | ORAL | Status: DC

## 2014-10-25 NOTE — Telephone Encounter (Signed)
Emergency Line / After Hours Call  Treated for candidal vaginitis with fluconazole last month. Started having similar symptoms. Itching started again and getting worse. Having vulvar edema.  In a monogamous relationship with her husband.   Will send in diflucan x 1. Advised to f/u with pcp for follow up and treatment of other chronic issues.   Clare GandyJeremy Angelino Rumery, MD Family Medicine PGY-3

## 2014-10-29 ENCOUNTER — Encounter: Payer: Self-pay | Admitting: Family Medicine

## 2014-10-29 ENCOUNTER — Ambulatory Visit (INDEPENDENT_AMBULATORY_CARE_PROVIDER_SITE_OTHER): Payer: 59 | Admitting: Family Medicine

## 2014-10-29 VITALS — BP 112/79 | HR 86 | Wt 215.5 lb

## 2014-10-29 DIAGNOSIS — E8881 Metabolic syndrome: Secondary | ICD-10-CM

## 2014-10-29 DIAGNOSIS — E119 Type 2 diabetes mellitus without complications: Secondary | ICD-10-CM

## 2014-10-29 DIAGNOSIS — B373 Candidiasis of vulva and vagina: Secondary | ICD-10-CM | POA: Diagnosis not present

## 2014-10-29 DIAGNOSIS — N898 Other specified noninflammatory disorders of vagina: Secondary | ICD-10-CM

## 2014-10-29 DIAGNOSIS — B3731 Acute candidiasis of vulva and vagina: Secondary | ICD-10-CM

## 2014-10-29 DIAGNOSIS — L298 Other pruritus: Secondary | ICD-10-CM

## 2014-10-29 LAB — POCT WET PREP (WET MOUNT): CLUE CELLS WET PREP WHIFF POC: NEGATIVE

## 2014-10-29 LAB — POCT GLYCOSYLATED HEMOGLOBIN (HGB A1C): HEMOGLOBIN A1C: 9

## 2014-10-29 MED ORDER — ONETOUCH ULTRASOFT LANCETS MISC: Status: DC

## 2014-10-29 MED ORDER — METFORMIN HCL 500 MG PO TABS: 500.0000 mg | ORAL_TABLET | Freq: Two times a day (BID) | ORAL | Status: DC

## 2014-10-29 MED ORDER — FLUCONAZOLE 150 MG PO TABS: 150.0000 mg | ORAL_TABLET | ORAL | Status: DC

## 2014-10-29 MED ORDER — GLUCOSE BLOOD VI STRP
ORAL_STRIP | Status: DC
Start: 2014-10-29 — End: 2014-11-05

## 2014-10-29 MED ORDER — ONETOUCH ULTRA 2 W/DEVICE KIT: PACK | Status: AC

## 2014-10-29 NOTE — Progress Notes (Signed)
    Subjective   Sydney Pierce is a 29 y.o. female that presents for a same day visit  1. Yeast infections: States she has had 3 yeast infections. She has been prescribed Diflucan in the past, which helped about one month ago. But she had a recurrence of symptoms. Her symptoms include itchiness and irritation. Diflucan taken 4 days ago helped but symptoms still persisting. She tried the monistat cream which did not help and made symptoms worse. No fevers, no vaginal discharge, no odor.  ROS Per HPI  History  Substance Use Topics  . Smoking status: Never Smoker   . Smokeless tobacco: Not on file  . Alcohol Use: Yes     Comment: 4 beers/year    No Known Allergies  Objective   BP 112/79 mmHg  Pulse 86  Wt 215 lb 8 oz (97.75 kg)  General: Well appearing. Genitourinary: No visible external lesions on genitalia. Vagina with minimal white discharge. Cervix appears normal with ectropion present.  Assessment and Plan   Meds ordered this encounter  Medications  . metFORMIN (GLUCOPHAGE) 500 MG tablet    Sig: Take 1 tablet (500 mg total) by mouth 2 (two) times daily with a meal.    Dispense:  180 tablet    Refill:  3  . fluconazole (DIFLUCAN) 150 MG tablet    Sig: Take 1 tablet (150 mg total) by mouth every 3 (three) days.    Dispense:  3 tablet    Refill:  0  . Blood Glucose Monitoring Suppl (ONE TOUCH ULTRA 2) W/DEVICE KIT    Sig: Check blood sugar once daily in the morning immediately after you wake up    Dispense:  1 each    Refill:  0  . Lancets (ONETOUCH ULTRASOFT) lancets    Sig: Use as instructed    Dispense:  100 each    Refill:  12  . glucose blood (ONE TOUCH ULTRA TEST) test strip    Sig: Use as instructed    Dispense:  100 each    Refill:  12    Vaginal itching Diabetes mellitus type 2  Patient requested A1C and found to be 9. May be contributing to multiple episodes of candidal infections. She used to be on metformin for PCOS but has not been using it  recently.   Metformin 551m BID  Although wet prep not significant for yeast, with recent application of monostat, results may be falsely negative. Will start Diflucan [id number] hours #3  Prompt follow-up with PCP for diabetes

## 2014-10-29 NOTE — Patient Instructions (Signed)
Thank you for coming to see me today. It was a pleasure. Today we talked about:   Hyperglycemia: Your A1C suggest that you have diabetes. I am starting you on Metformin 500mg  twice per day. Please make an appointment as soon as possible to see Dr. Jarvis NewcomerGrunz. As we discussed, this is a diagnosis that has a possibility of being reversed. Weight loss is big, in addition to diet and medication management. We will need to discuss this more during your follow-up.  Vaginal itching: there did not seem to be any yeast on your wet prep, but with your recent treatments and worsening symptoms, I will treat you with a prolonged course of diflucan.  If you have any questions or concerns, please do not hesitate to call the office at [phone].  Sincerely,  Jacquelin Hawkingalph Adetokunbo Mccadden, MD

## 2014-10-30 NOTE — Progress Notes (Signed)
Thanks for the heads up, I'll see her next Thursday.

## 2014-11-05 ENCOUNTER — Ambulatory Visit (INDEPENDENT_AMBULATORY_CARE_PROVIDER_SITE_OTHER): Payer: 59 | Admitting: Family Medicine

## 2014-11-05 ENCOUNTER — Encounter: Payer: Self-pay | Admitting: Family Medicine

## 2014-11-05 VITALS — BP 122/67 | HR 78 | Ht 65.0 in | Wt 211.0 lb

## 2014-11-05 DIAGNOSIS — R7309 Other abnormal glucose: Secondary | ICD-10-CM | POA: Diagnosis not present

## 2014-11-05 DIAGNOSIS — L83 Acanthosis nigricans: Secondary | ICD-10-CM | POA: Diagnosis not present

## 2014-11-05 DIAGNOSIS — E669 Obesity, unspecified: Secondary | ICD-10-CM | POA: Diagnosis not present

## 2014-11-05 DIAGNOSIS — R739 Hyperglycemia, unspecified: Secondary | ICD-10-CM

## 2014-11-05 DIAGNOSIS — E119 Type 2 diabetes mellitus without complications: Secondary | ICD-10-CM

## 2014-11-05 LAB — LIPID PANEL
CHOL/HDL RATIO: 3.1 ratio
Cholesterol: 122 mg/dL (ref 0–200)
HDL: 39 mg/dL — ABNORMAL LOW (ref >=46)
LDL Cholesterol: 55 mg/dL (ref 0–99)
Triglycerides: 142 mg/dL (ref <150)
VLDL: 28 mg/dL (ref 0–40)

## 2014-11-05 LAB — BASIC METABOLIC PANEL
BUN: 12 mg/dL (ref 6–23)
CALCIUM: 9.2 mg/dL (ref 8.4–10.5)
CO2: 25 mEq/L (ref 19–32)
CREATININE: 0.6 mg/dL (ref 0.50–1.10)
Chloride: 102 mEq/L (ref 96–112)
Glucose, Bld: 95 mg/dL (ref 70–99)
Potassium: 3.9 mEq/L (ref 3.5–5.3)
SODIUM: 139 meq/L (ref 135–145)

## 2014-11-05 MED ORDER — GLUCOSE BLOOD VI STRP: ORAL_STRIP | Status: AC

## 2014-11-05 MED ORDER — ONETOUCH ULTRASOFT LANCETS MISC: Status: AC

## 2014-11-05 NOTE — Patient Instructions (Signed)
You will be receiving a call regarding your next appointment to meet with Dr. Gerilyn Pilgrim, our nutrition doctor.   Type 2 Diabetes Mellitus Type 2 diabetes mellitus, often simply referred to as type 2 diabetes, is a long-lasting (chronic) disease. In type 2 diabetes, the pancreas does not make enough insulin (a hormone), the cells are less responsive to the insulin that is made (insulin resistance), or both. Normally, insulin moves sugars from food into the tissue cells. The tissue cells use the sugars for energy. The lack of insulin or the lack of normal response to insulin causes excess sugars to build up in the blood instead of going into the tissue cells. As a result, high blood sugar (hyperglycemia) develops. The effect of high sugar (glucose) levels can cause many complications. Type 2 diabetes was also previously called adult-onset diabetes, but it can occur at any age.  RISK FACTORS  A person is predisposed to developing type 2 diabetes if someone in the family has the disease and also has one or more of the following primary risk factors:  Overweight.  An inactive lifestyle.  A history of consistently eating high-calorie foods. Maintaining a normal weight and regular physical activity can reduce the chance of developing type 2 diabetes. SYMPTOMS  A person with type 2 diabetes may not show symptoms initially. The symptoms of type 2 diabetes appear slowly. The symptoms include:  Increased thirst (polydipsia).  Increased urination (polyuria).  Increased urination during the night (nocturia).  Weight loss. This weight loss may be rapid.  Frequent, recurring infections.  Tiredness (fatigue).  Weakness.  Vision changes, such as blurred vision.  Fruity smell to your breath.  Abdominal pain.  Nausea or vomiting.  Cuts or bruises which are slow to heal.  Tingling or numbness in the hands or feet. DIAGNOSIS Type 2 diabetes is frequently not diagnosed until complications of  diabetes are present. Type 2 diabetes is diagnosed when symptoms or complications are present and when blood glucose levels are increased. Your blood glucose level may be checked by one or more of the following blood tests:  A fasting blood glucose test. You will not be allowed to eat for at least 8 hours before a blood sample is taken.  A random blood glucose test. Your blood glucose is checked at any time of the day regardless of when you ate.  A hemoglobin A1c blood glucose test. A hemoglobin A1c test provides information about blood glucose control over the previous 3 months.  An oral glucose tolerance test (OGTT). Your blood glucose is measured after you have not eaten (fasted) for 2 hours and then after you drink a glucose-containing beverage. TREATMENT   You may need to take insulin or diabetes medicine daily to keep blood glucose levels in the desired range.  If you use insulin, you may need to adjust the dosage depending on the carbohydrates that you eat with each meal or snack. The treatment goal is to maintain the before meal blood sugar (preprandial glucose) level at 70-130 mg/dL. HOME CARE INSTRUCTIONS   Have your hemoglobin A1c level checked twice a year.  Perform daily blood glucose monitoring as directed by your health care provider.  Monitor urine ketones when you are ill and as directed by your health care provider.  Take your diabetes medicine or insulin as directed by your health care provider to maintain your blood glucose levels in the desired range.  Never run out of diabetes medicine or insulin. It is needed every day.  If you are using insulin, you may need to adjust the amount of insulin given based on your intake of carbohydrates. Carbohydrates can raise blood glucose levels but need to be included in your diet. Carbohydrates provide vitamins, minerals, and fiber which are an essential part of a healthy diet. Carbohydrates are found in fruits, vegetables, whole  grains, dairy products, legumes, and foods containing added sugars.  Eat healthy foods. You should make an appointment to see a registered dietitian to help you create an eating plan that is right for you.  Lose weight if you are overweight.  Carry a medical alert card or wear your medical alert jewelry.  Carry a 15-gram carbohydrate snack with you at all times to treat low blood glucose (hypoglycemia). Some examples of 15-gram carbohydrate snacks include:  Glucose tablets, 3 or 4.  Glucose gel, 15-gram tube.  Raisins, 2 tablespoons (24 grams).  Jelly beans, 6.  Animal crackers, 8.  Regular pop, 4 ounces (120 mL).  Gummy treats, 9.  Recognize hypoglycemia. Hypoglycemia occurs with blood glucose levels of 70 mg/dL and below. The risk for hypoglycemia increases when fasting or skipping meals, during or after intense exercise, and during sleep. Hypoglycemia symptoms can include:  Tremors or shakes.  Decreased ability to concentrate.  Sweating.  Increased heart rate.  Headache.  Dry mouth.  Hunger.  Irritability.  Anxiety.  Restless sleep.  Altered speech or coordination.  Confusion.  Treat hypoglycemia promptly. If you are alert and able to safely swallow, follow the 15:15 rule:  Take 15-20 grams of rapid-acting glucose or carbohydrate. Rapid-acting options include glucose gel, glucose tablets, or 4 ounces (120 mL) of fruit juice, regular soda, or low-fat milk.  Check your blood glucose level 15 minutes after taking the glucose.  Take 15-20 grams more of glucose if the repeat blood glucose level is still 70 mg/dL or below.  Eat a meal or snack within 1 hour once blood glucose levels return to normal.  Be alert to feeling very thirsty and urinating more frequently than usual, which are early signs of hyperglycemia. An early awareness of hyperglycemia allows for prompt treatment. Treat hyperglycemia as directed by your health care provider.  Engage in at  least 150 minutes of moderate-intensity physical activity a week, spread over at least 3 days of the week or as directed by your health care provider. In addition, you should engage in resistance exercise at least 2 times a week or as directed by your health care provider. Try to spend no more than 90 minutes at one time inactive.  Adjust your medicine and food intake as needed if you start a new exercise or sport.  Follow your sick-day plan anytime you are unable to eat or drink as usual.  Do not use any tobacco products including cigarettes, chewing tobacco, or electronic cigarettes. If you need help quitting, ask your health care provider.  Limit alcohol intake to no more than 1 drink per day for nonpregnant women and 2 drinks per day for men. You should drink alcohol only when you are also eating food. Talk with your health care provider whether alcohol is safe for you. Tell your health care provider if you drink alcohol several times a week.  Keep all follow-up visits as directed by your health care provider. This is important.  Schedule an eye exam soon after the diagnosis of type 2 diabetes and then annually.  Perform daily skin and foot care. Examine your skin and feet daily for cuts, bruises,  redness, nail problems, bleeding, blisters, or sores. A foot exam by a health care provider should be done annually.  Brush your teeth and gums at least twice a day and floss at least once a day. Follow up with your dentist regularly.  Share your diabetes management plan with your workplace or school.  Stay up-to-date with immunizations. It is recommended that people with diabetes who are over 16 years old get the pneumonia vaccine. In some cases, two separate shots may be given. Ask your health care provider if your pneumonia vaccination is up-to-date.  Learn to manage stress.  Obtain ongoing diabetes education and support as needed.  Participate in or seek rehabilitation as needed to  maintain or improve independence and quality of life. Request a physical or occupational therapy referral if you are having foot or hand numbness, or difficulties with grooming, dressing, eating, or physical activity. SEEK MEDICAL CARE IF:   You are unable to eat food or drink fluids for more than 6 hours.  You have nausea and vomiting for more than 6 hours.  Your blood glucose level is over 240 mg/dL.  There is a change in mental status.  You develop an additional serious illness.  You have diarrhea for more than 6 hours.  You have been sick or have had a fever for a couple of days and are not getting better.  You have pain during any physical activity.  SEEK IMMEDIATE MEDICAL CARE IF:  You have difficulty breathing.  You have moderate to large ketone levels. MAKE SURE YOU:  Understand these instructions.  Will watch your condition. Will get help right away if you are not doing well or get worse.  Basic Carbohydrate Counting for Diabetes Mellitus Carbohydrate counting is a method for keeping track of the amount of carbohydrates you eat. Eating carbohydrates naturally increases the level of sugar (glucose) in your blood, so it is important for you to know the amount that is okay for you to have in every meal. Carbohydrate counting helps keep the level of glucose in your blood within normal limits. The amount of carbohydrates allowed is different for every person. A dietitian can help you calculate the amount that is right for you. Once you know the amount of carbohydrates you can have, you can count the carbohydrates in the foods you want to eat. Carbohydrates are found in the following foods:  Grains, such as breads and cereals.  Dried beans and soy products.  Starchy vegetables, such as potatoes, peas, and corn.  Fruit and fruit juices.  Milk and yogurt.  Sweets and snack foods, such as cake, cookies, candy, chips, soft drinks, and fruit drinks. CARBOHYDRATE  COUNTING There are two ways to count the carbohydrates in your food. You can use either of the methods or a combination of both. Reading the "Nutrition Facts" on Packaged Food The "Nutrition Facts" is an area that is included on the labels of almost all packaged food and beverages in the [place]. It includes the serving size of that food or beverage and information about the nutrients in each serving of the food, including the grams (g) of carbohydrate per serving.  Decide the number of servings of this food or beverage that you will be able to eat or drink. Multiply that number of servings by the number of grams of carbohydrate that is listed on the label for that serving. The total will be the amount of carbohydrates you will be having when you eat or drink this  food or beverage. Learning Standard Serving Sizes of Food When you eat food that is not packaged or does not include "Nutrition Facts" on the label, you need to measure the servings in order to count the amount of carbohydrates.A serving of most carbohydrate-rich foods contains about 15 g of carbohydrates. The following list includes serving sizes of carbohydrate-rich foods that provide 15 g ofcarbohydrate per serving:   1 slice of bread (1 oz) or 1 six-inch tortilla.    of a hamburger bun or English muffin.  4-6 crackers.   cup unsweetened dry cereal.    cup hot cereal.   cup rice or pasta.    cup mashed potatoes or  of a large baked potato.  1 cup fresh fruit or one small piece of fruit.    cup canned or frozen fruit or fruit juice.  1 cup milk.   cup plain fat-free yogurt or yogurt sweetened with artificial sweeteners.   cup cooked dried beans or starchy vegetable, such as peas, corn, or potatoes.  Decide the number of standard-size servings that you will eat. Multiply that number of servings by 15 (the grams of carbohydrates in that serving). For example, if you eat 2 cups of strawberries, you will  have eaten 2 servings and 30 g of carbohydrates (2 servings x 15 g = 30 g). For foods such as soups and casseroles, in which more than one food is mixed in, you will need to count the carbohydrates in each food that is included. EXAMPLE OF CARBOHYDRATE COUNTING Sample Dinner  3 oz chicken breast.   cup of brown rice.   cup of corn.  1 cup milk.   1 cup strawberries with sugar-free whipped topping.  Carbohydrate Calculation Step 1: Identify the foods that contain carbohydrates:   Rice.   Corn.   Milk.   Strawberries. Step 2:Calculate the number of servings eaten of each:   2 servings of rice.   1 serving of corn.   1 serving of milk.   1 serving of strawberries. Step 3: Multiply each of those number of servings by 15 g:   2 servings of rice x 15 g = 30 g.   1 serving of corn x 15 g = 15 g.   1 serving of milk x 15 g = 15 g.   1 serving of strawberries x 15 g = 15 g. Step 4: Add together all of the amounts to find the total grams of carbohydrates eaten: 30 g + 15 g + 15 g + 15 g = 75 g. Document Released: 04/10/2005 Document Revised: 08/25/2013 Document Reviewed: 03/07/2013 [facility] Patient Information 2015 [place]. This information is not intended to replace advice given to you by your health care provider. Make sure you discuss any questions you have with your health care provider.

## 2014-11-05 NOTE — Assessment & Plan Note (Addendum)
Weight loss strategies discussed. The primacy of weight loss in the treatment of her diabetes was emphasized. I suspect she will, at least in the short term, be very amenable to lifestyle changes. Will refer for MNT.

## 2014-11-05 NOTE — Progress Notes (Signed)
Subjective: Sydney Pierce is a 29 y.o. female Sydney Geraldspatient of mine who was recently diagnosed with T2DM presenting an initial visit concerning diabetes.   At a visit on 7/7 for recurrent yeast infections, she requested an A1c which was 9%. She was started on metformin 500mg  BID and glucose testing supplies. Her Hb A1c had previously been borderline and she had been treated with metformin for PCOS but discontinued it. She reports having a lot of cake, gummy bears, other candy, SSB's daily for the previous 3 months or so - more than usual due to more family gatherings and celebrations. She had noticed increased thirst and increased urination but denies vision changes or polyphagia.   She brings records of 3 glucose checks per day with the early AM numbers being 130 - 180mg /dl and the highest value of all checks was 200. She reports feeling somewhat jittery at times since the last visit and also has had some nausea and loose stools.   She is quite nervous about this diagnosis because she saw her father who was diagnosed with DM around age 29 suffer from the sequelae of long term uncontrolled diabetes, name kidney disease and retinopathy. She has many questions regarding diet, water intake, activity, and asks if she can get cured of this if she loses enough weight and changes her lifestyle habits.    - ROS: Denies fever, chills, weight loss, dizziness, vision changes, syncope, nocturia, paresthesias, chest pain, new wounds. - PMFSH: Has been obese since childhood and suffered from PCOS, recurrent vaginal candidiasis. No surgeries. FH as above. Works as a [employer] [place] [employer] which is a sedentary position, cooks many of her meals, non smoker, no EtOH, no illicit drugs.  - Medications: reviewed and updated; She was on phentermine in 2014.   Objective: BP 122/67 mmHg  Pulse 78  Ht 5\' 5"  (1.651 m)  Wt 211 lb (95.709 kg)  BMI 35.11 kg/m2  LMP 10/09/2014 (Exact Date) Gen: Well-appearing, obese 28 y.o.female in  no distress HEENT: Normocephalic, sclerae/conjunctivae clear, PERRL, MMM, posterior oropharynx clear, good dentition Neck: Neck supple, no masses or lymphadenopathy; thyroid not enlarged  Pulm: Non-labored; CTAB, no wheezes  CV: Regular rate, no murmur appreciated; no LE edema, no JVD GI: Normoactive BS; soft, non-tender, non-distended, no HSM Skin: No wounds or rashes, + acanthosis nigricans See diabetic foot exam - simple  Neuro: CN II-XII without deficits, sensation intact to light touch, steady gait.  Assessment & Plan: Sydney GeraldsMariel B Emley is a 29 y.o. female here for an initial visit for T2DM.  See problem list.

## 2014-11-05 NOTE — Assessment & Plan Note (Signed)
Dx 10/29/2014 Hb A1c 9%. We discussed at length topics including diet, exercise, natural progression and pathophysiology of type 2 diabetes.  - Check CBG qAM only. Continue metformin 500mg  BID if tolerated. If not, she will call and we will try 500mg  once a day and/or ER formulations. Refer this eager pt to Dr. Gerilyn PilgrimSykes for MNT for diabetes. Check renal function for baseline (formerly normal). Check lipids, not in a statin benefit group strictly speaking.

## 2014-11-06 ENCOUNTER — Encounter: Payer: Self-pay | Admitting: Family Medicine

## 2014-11-09 ENCOUNTER — Telehealth: Payer: Self-pay | Admitting: Family Medicine

## 2014-11-09 NOTE — Telephone Encounter (Signed)
Pt called and would like her test results from 7/7 A1C and 7/14 Lipid panel and Metabolic panel so that she can take them to her doctor. Thie way she would not have to repeat the test.  She will be by today about 2:15 to pick these up. jw

## 2014-12-17 ENCOUNTER — Encounter: Payer: 59 | Attending: Internal Medicine | Admitting: *Deleted

## 2014-12-17 ENCOUNTER — Encounter: Payer: Self-pay | Admitting: *Deleted

## 2014-12-17 VITALS — Ht 65.0 in | Wt 204.0 lb

## 2014-12-17 DIAGNOSIS — E118 Type 2 diabetes mellitus with unspecified complications: Secondary | ICD-10-CM | POA: Insufficient documentation

## 2014-12-17 DIAGNOSIS — Z713 Dietary counseling and surveillance: Secondary | ICD-10-CM | POA: Diagnosis not present

## 2014-12-17 DIAGNOSIS — E119 Type 2 diabetes mellitus without complications: Secondary | ICD-10-CM | POA: Diagnosis present

## 2014-12-17 NOTE — Patient Instructions (Signed)
Plan:  Aim for 3 Carb Choices per meal (45 grams) +/- 1 either way  Aim for 0-1 Carbs per snack if hungry  Include protein in moderation with your meals and snacks Consider reading food labels for Total Carbohydrate of foods Continue with your activity level daily as tolerated Consider checking BG at alternate times per day  Continue taking Diabetes medication Metformin as directed by MD

## 2014-12-24 NOTE — Progress Notes (Deleted)
Diabetes Self-Management Education  Visit Type: First/Initial  Appt. Start Time: *** Appt. End Time: ***  12/24/2014  Sydney Pierce, identified by name and date of birth, is a 29 y.o. female with a diagnosis of Diabetes: Type 2.   ASSESSMENT  Height  (1.651 m), weight 204 lb (92.534 kg). Body mass index is 33.95 kg/(m^2).    Individualized Plan for Diabetes Self-Management Training:   Learning Objective:  Patient will have a greater understanding of diabetes self-management. Patient education plan is to attend individual and/or group sessions per assessed needs and concerns.   Plan:   Patient Instructions  Plan:  Aim for 3 Carb Choices per meal (45 grams) +/- 1 either way  Aim for 0-1 Carbs per snack if hungry  Include protein in moderation with your meals and snacks Consider reading food labels for Total Carbohydrate of foods Continue with your activity level daily as tolerated Consider checking BG at alternate times per day  Continue taking Diabetes medication Metformin as directed by MD      Expected Outcomes:  Demonstrated interest in learning. Expect positive outcomes  Education material provided: {CHL AMB DSME EDUCATION MATERIAL:22611}  If problems or questions, patient to contact team via:  {TYPE OF CONTACT:20355}  Future DSME appointment:

## 2014-12-24 NOTE — Progress Notes (Signed)
Diabetes Self-Management Education  Visit Type: First/Initial  Appt. Start Time: 1430 Appt. End Time: 1600  12/24/2014  Sydney Pierce, identified by name and date of birth, is a 29 y.o. female with a diagnosis of Diabetes: Type 2.   ASSESSMENT  Height 5\' 5"  (1.651 m), weight 204 lb (92.534 kg). Body mass index is 33.95 kg/(m^2).      Diabetes Self-Management Education - 12/24/14 1026    Visit Information   Visit Type First/Initial   Initial Visit   Diabetes Type Type 2   Are you taking your medications as prescribed? Yes   Health Coping   How would you rate your overall health? Good   Psychosocial Assessment   Patient Belief/Attitude about Diabetes Motivated to manage diabetes   Self-care barriers None   Self-management support Friends;Family   Other persons present Patient   Patient Concerns Nutrition/Meal planning;Glycemic Control   Special Needs None   How often do you need to have someone help you when you read instructions, pamphlets, or other written materials from your doctor or pharmacy? 1 - Never   What is the last grade level you completed in school? 12th grade   Complications   Last HgB A1C per patient/outside source 9 %   How often do you check your blood sugar? 1-2 times/day   Number of hypoglycemic episodes per month 0   Dietary Intake   Breakfast 1 each of flavored and regular oatmeal OR [place] slices with spinach inside OR trudey sandwich   Snack (morning) occasionally sugar free jello OR apple OR almonds   Lunch brings from home, leftovers with meat, rice or beans, 2 tortillas OR subway 6" turekey on wheat bread OR meal salad   Snack (afternoon) same as AM   Dinner meat or chicken with rice or beans and 2 tortillas   Snack (evening) not unless fresh fruit   Beverage(s) water   Exercise   Exercise Type Light (walking / raking leaves)  walks during her lunch break and again after work for total of 1 hour   How many days per week to you exercise? 5   How many minutes per day do you exercise? 60   Total minutes per week of exercise 300   Patient Education   Previous Diabetes Education No   Disease state  Definition of diabetes, type 1 and 2, and the diagnosis of diabetes   Nutrition management  Role of diet in the treatment of diabetes and the relationship between the three main macronutrients and blood glucose level;Food label reading, portion sizes and measuring food.;Carbohydrate counting   Physical activity and exercise  Role of exercise on diabetes management, blood pressure control and cardiac health.   Medications Reviewed patients medication for diabetes, action, purpose, timing of dose and side effects.   Monitoring Purpose and frequency of SMBG.;Identified appropriate SMBG and/or A1C goals.   Chronic complications Relationship between chronic complications and blood glucose control   Psychosocial adjustment Role of stress on diabetes   Personal strategies to promote health Helped patient develop diabetes management plan for (enter comment)   Individualized Goals (developed by patient)   Nutrition Follow meal plan discussed   Physical Activity Exercise 3-5 times per week   Medications take my medication as prescribed   Monitoring  test my blood glucose as discussed;test blood glucose pre and post meals as discussed   Outcomes   Expected Outcomes Demonstrated interest in learning. Expect positive outcomes   Future DMSE PRN   Program  Status Completed     TANITA  BODY COMP RESULTS 12/17/14  Weight (lbs)  204.5   BMI (kg/m^2)  34.0   Fat Mass (lbs) 87   Fat Free Mass (lbs) 117.5   Total Body Water (lbs) 89    Individualized Plan for Diabetes Self-Management Training:   Learning Objective:  Patient will have a greater understanding of diabetes self-management. Patient education plan is to attend individual and/or group sessions per assessed needs and concerns.   Plan:   Patient Instructions  Plan:  Aim for 3 Carb Choices  per meal (45 grams) +/- 1 either way  Aim for 0-1 Carbs per snack if hungry  Include protein in moderation with your meals and snacks Consider reading food labels for Total Carbohydrate of foods Continue with your activity level daily as tolerated Consider checking BG at alternate times per day  Continue taking Diabetes medication Metformin as directed by MD      Expected Outcomes:  Demonstrated interest in learning. Expect positive outcomes  Education material provided: Living Well with Diabetes, A1C conversion sheet, Meal plan card, Support group flyer and Carbohydrate counting sheet  If problems or questions, patient to contact team via:  Phone and Email  Future DSME appointment: PRN

## 2015-07-26 ENCOUNTER — Other Ambulatory Visit: Payer: Self-pay | Admitting: *Deleted

## 2015-07-26 DIAGNOSIS — E8881 Metabolic syndrome: Secondary | ICD-10-CM

## 2015-07-27 MED ORDER — METFORMIN HCL 500 MG PO TABS: 500.0000 mg | ORAL_TABLET | Freq: Two times a day (BID) | ORAL | Status: AC

## 2015-07-27 MED ORDER — CETIRIZINE HCL 10 MG PO TABS: 10.0000 mg | ORAL_TABLET | Freq: Every day | ORAL | Status: AC

## 2015-07-29 ENCOUNTER — Other Ambulatory Visit: Payer: Self-pay | Admitting: *Deleted

## 2015-07-29 NOTE — Telephone Encounter (Signed)
Opened in error. Fleeger, Jessica Dawn, CMA  

## 2015-08-03 NOTE — Telephone Encounter (Signed)
Called pt to remind her about making a follow up apt. [facility] and they would take care of watching her A1C.

## 2015-08-09 ENCOUNTER — Other Ambulatory Visit: Payer: Self-pay | Admitting: Family Medicine

## 2016-01-12 ENCOUNTER — Other Ambulatory Visit: Payer: Self-pay | Admitting: Internal Medicine

## 2016-01-12 DIAGNOSIS — R109 Unspecified abdominal pain: Secondary | ICD-10-CM

## 2016-01-12 DIAGNOSIS — R102 Pelvic and perineal pain: Secondary | ICD-10-CM

## 2016-01-19 ENCOUNTER — Other Ambulatory Visit: Payer: 59

## 2016-07-27 ENCOUNTER — Other Ambulatory Visit: Payer: Self-pay | Admitting: Internal Medicine

## 2016-07-27 ENCOUNTER — Other Ambulatory Visit (HOSPITAL_COMMUNITY)
Admission: RE | Admit: 2016-07-27 | Discharge: 2016-07-27 | Disposition: A | Discharge status: Home / Self Care | Payer: 59 | Source: Ambulatory Visit | Attending: Internal Medicine | Admitting: Internal Medicine

## 2016-07-27 DIAGNOSIS — Z113 Encounter for screening for infections with a predominantly sexual mode of transmission: Secondary | ICD-10-CM | POA: Insufficient documentation

## 2016-07-27 DIAGNOSIS — Z01419 Encounter for gynecological examination (general) (routine) without abnormal findings: Secondary | ICD-10-CM | POA: Insufficient documentation

## 2016-07-27 DIAGNOSIS — E1165 Type 2 diabetes mellitus with hyperglycemia: Secondary | ICD-10-CM | POA: Diagnosis not present

## 2016-07-27 DIAGNOSIS — Z0000 Encounter for general adult medical examination without abnormal findings: Secondary | ICD-10-CM | POA: Diagnosis not present

## 2016-07-29 DIAGNOSIS — Z0100 Encounter for examination of eyes and vision without abnormal findings: Secondary | ICD-10-CM | POA: Diagnosis not present

## 2016-08-02 LAB — CYTOLOGY - PAP
DIAGNOSIS: NEGATIVE
HPV: NOT DETECTED

## 2016-08-08 ENCOUNTER — Telehealth: Payer: Self-pay | Admitting: Student

## 2016-10-09 NOTE — Telephone Encounter (Signed)
[facility] measuring 6.3. [facility], and wants to be seen by a doctor who wont leave in 2 years. She will call back to schedule when it's time to have her A1c checked. - Sydney Pierce

## 2016-12-08 DIAGNOSIS — E1165 Type 2 diabetes mellitus with hyperglycemia: Secondary | ICD-10-CM | POA: Diagnosis not present

## 2017-01-08 DIAGNOSIS — G43A1 Cyclical vomiting, intractable: Secondary | ICD-10-CM | POA: Diagnosis not present

## 2017-01-08 DIAGNOSIS — R509 Fever, unspecified: Secondary | ICD-10-CM | POA: Diagnosis not present

## 2017-01-08 DIAGNOSIS — B349 Viral infection, unspecified: Secondary | ICD-10-CM | POA: Diagnosis not present

## 2017-05-09 DIAGNOSIS — L600 Ingrowing nail: Secondary | ICD-10-CM | POA: Diagnosis not present

## 2017-05-09 DIAGNOSIS — E1165 Type 2 diabetes mellitus with hyperglycemia: Secondary | ICD-10-CM | POA: Diagnosis not present

## 2017-05-09 DIAGNOSIS — J301 Allergic rhinitis due to pollen: Secondary | ICD-10-CM | POA: Diagnosis not present

## 2017-05-09 DIAGNOSIS — E781 Pure hyperglyceridemia: Secondary | ICD-10-CM | POA: Diagnosis not present

## 2017-05-18 ENCOUNTER — Ambulatory Visit: Payer: Self-pay | Admitting: Podiatry

## 2017-07-02 DIAGNOSIS — L0231 Cutaneous abscess of buttock: Secondary | ICD-10-CM | POA: Diagnosis not present

## 2017-09-24 DIAGNOSIS — R102 Pelvic and perineal pain: Secondary | ICD-10-CM | POA: Diagnosis not present

## 2018-01-08 DIAGNOSIS — M40292 Other kyphosis, cervical region: Secondary | ICD-10-CM | POA: Diagnosis not present

## 2018-01-08 DIAGNOSIS — M41126 Adolescent idiopathic scoliosis, lumbar region: Secondary | ICD-10-CM | POA: Diagnosis not present

## 2018-01-08 DIAGNOSIS — R293 Abnormal posture: Secondary | ICD-10-CM | POA: Diagnosis not present

## 2018-01-14 DIAGNOSIS — Z23 Encounter for immunization: Secondary | ICD-10-CM | POA: Diagnosis not present

## 2018-01-14 DIAGNOSIS — E1165 Type 2 diabetes mellitus with hyperglycemia: Secondary | ICD-10-CM | POA: Diagnosis not present

## 2018-01-14 DIAGNOSIS — L97519 Non-pressure chronic ulcer of other part of right foot with unspecified severity: Secondary | ICD-10-CM | POA: Diagnosis not present

## 2018-03-15 DIAGNOSIS — J209 Acute bronchitis, unspecified: Secondary | ICD-10-CM | POA: Diagnosis not present

## 2018-04-02 DIAGNOSIS — J209 Acute bronchitis, unspecified: Secondary | ICD-10-CM | POA: Diagnosis not present

## 2018-05-06 ENCOUNTER — Other Ambulatory Visit: Payer: Self-pay | Admitting: Internal Medicine

## 2018-05-06 DIAGNOSIS — Z227 Latent tuberculosis: Secondary | ICD-10-CM

## 2018-05-06 DIAGNOSIS — J40 Bronchitis, not specified as acute or chronic: Secondary | ICD-10-CM | POA: Diagnosis not present

## 2018-05-06 DIAGNOSIS — R0602 Shortness of breath: Secondary | ICD-10-CM

## 2018-05-07 ENCOUNTER — Ambulatory Visit: Payer: 59 | Admitting: Internal Medicine

## 2018-05-07 ENCOUNTER — Encounter: Payer: Self-pay | Admitting: *Deleted

## 2018-05-07 DIAGNOSIS — Z227 Latent tuberculosis: Secondary | ICD-10-CM | POA: Diagnosis not present

## 2018-05-07 LAB — PULMONARY FUNCTION TEST
DL/VA % pred: 129 %
DL/VA: 6.35 ml/min/mmHg/L
DLCO UNC % PRED: 110 %
DLCO unc: 28.33 ml/min/mmHg
FEF 25-75 PRE: 3.09 L/s
FEF 25-75 Post: 2.19 L/sec
FEF2575-%Change-Post: -29 %
FEF2575-%PRED-POST: 63 %
FEF2575-%Pred-Pre: 89 %
FEV1-%CHANGE-POST: -14 %
FEV1-%PRED-POST: 79 %
FEV1-%Pred-Pre: 92 %
FEV1-PRE: 3.02 L
FEV1-Post: 2.58 L
FEV1FVC-%Change-Post: -14 %
FEV1FVC-%PRED-PRE: 99 %
FEV6-%Change-Post: -2 %
FEV6-%PRED-POST: 92 %
FEV6-%Pred-Pre: 94 %
FEV6-Post: 3.54 L
FEV6-Pre: 3.62 L
FEV6FVC-%CHANGE-POST: -1 %
FEV6FVC-%Pred-Post: 99 %
FEV6FVC-%Pred-Pre: 101 %
FVC-%Change-Post: 0 %
FVC-%PRED-PRE: 93 %
FVC-%Pred-Post: 92 %
FVC-POST: 3.61 L
FVC-PRE: 3.62 L
POST FEV1/FVC RATIO: 71 %
PRE FEV6/FVC RATIO: 100 %
Post FEV6/FVC ratio: 99 %
Pre FEV1/FVC ratio: 83 %
RV % PRED: 102 %
RV: 1.51 L
TLC % pred: 96 %
TLC: 5.01 L

## 2018-05-07 NOTE — Progress Notes (Signed)
PFT performed today. 

## 2018-05-20 ENCOUNTER — Ambulatory Visit: Payer: Self-pay | Admitting: Obstetrics & Gynecology

## 2018-05-30 DIAGNOSIS — Z3169 Encounter for other general counseling and advice on procreation: Secondary | ICD-10-CM | POA: Diagnosis not present

## 2018-05-30 DIAGNOSIS — Z3141 Encounter for fertility testing: Secondary | ICD-10-CM | POA: Diagnosis not present

## 2018-06-05 ENCOUNTER — Telehealth: Payer: Self-pay | Admitting: Internal Medicine

## 2018-06-05 DIAGNOSIS — J309 Allergic rhinitis, unspecified: Secondary | ICD-10-CM | POA: Diagnosis not present

## 2018-06-05 DIAGNOSIS — E282 Polycystic ovarian syndrome: Secondary | ICD-10-CM | POA: Diagnosis not present

## 2018-06-05 DIAGNOSIS — E781 Pure hyperglyceridemia: Secondary | ICD-10-CM | POA: Diagnosis not present

## 2018-06-05 DIAGNOSIS — E1165 Type 2 diabetes mellitus with hyperglycemia: Secondary | ICD-10-CM | POA: Diagnosis not present

## 2018-06-05 NOTE — Telephone Encounter (Signed)
This Patient was sent to [place] by the [facility] physicians they were  Calling to get the results of the pft test the requested.I have printed and faxed this to them nothing further needed at this time.

## 2018-06-05 NOTE — Telephone Encounter (Signed)
Called and spoke with [name] from [place]. She stated that they are needing records of the patients PFT. Sydney Pierce that they will need to send over a records release form. Fax number given. Will await form.

## 2018-07-17 ENCOUNTER — Ambulatory Visit: Payer: Self-pay | Admitting: Obstetrics & Gynecology

## 2018-07-31 ENCOUNTER — Telehealth: Payer: Self-pay | Admitting: Internal Medicine

## 2018-07-31 NOTE — Telephone Encounter (Signed)
We do not diagnose or treat when all we have done is to read the PFT. Make sure you are sending the interpreted report, not just a list of numbers, to the patient. She can get it to Dr Pete Glatter and she can communicate with him about any job letter she wants.

## 2018-07-31 NOTE — Telephone Encounter (Signed)
Preliminary report printed. Will be faxed to [facility] Attn: Dr. Pete Glatter.  Called the patient and advised her the report information sent to Dr. Laverle Hobby attention will be page 2 only because it has the report information they will need.  Patient asked about the information [facility] had regarding diagnosis. I told the patient that a diagnosis would be needed in order to run the PFT, however any detail that [facility] Sprang has in their chart she needs to discuss with them as we do not have access to their system to see what they have documented in her chart.  Patient voiced understanding. Nothing further needed at this time.

## 2018-07-31 NOTE — Telephone Encounter (Signed)
Called the patient and confirmed that she was told by [facility] she would have to contact our office to get the diagnosis information for the test.  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  I advised the patient that our office only read the PFT results, she has never been seen in our clinic for medical care.  The patient stated she contacted [facility] and found out the healthcare provider she was assigned to Levonne Lapping, NP) is no longer with the practice and the PFT diagnosis information will need to be sent to the attention of Dr. Pete Glatter.  Message routed to Dr. Maple Hudson.  Dr. Maple Hudson, can a copy of the PFT with the result information be sent to [facility]? Or can a letter be generated and faxed to them with the diagnosis information requested?

## 2019-03-07 ENCOUNTER — Other Ambulatory Visit: Payer: Self-pay

## 2019-03-07 DIAGNOSIS — Z20822 Contact with and (suspected) exposure to covid-19: Secondary | ICD-10-CM

## 2019-03-10 LAB — NOVEL CORONAVIRUS, NAA: SARS-CoV-2, NAA: NOT DETECTED

## 2019-10-07 ENCOUNTER — Ambulatory Visit
Admission: RE | Admit: 2019-10-07 | Discharge: 2019-10-07 | Disposition: A | Discharge status: Home / Self Care | Payer: 59 | Source: Ambulatory Visit | Attending: Geriatric Medicine | Admitting: Geriatric Medicine

## 2019-10-07 ENCOUNTER — Other Ambulatory Visit: Payer: Self-pay | Admitting: Geriatric Medicine

## 2019-10-07 DIAGNOSIS — M79671 Pain in right foot: Secondary | ICD-10-CM

## 2019-10-09 ENCOUNTER — Ambulatory Visit: Payer: 59 | Admitting: Family Medicine

## 2020-09-22 ENCOUNTER — Telehealth: Payer: Self-pay

## 2020-09-22 NOTE — Telephone Encounter (Signed)
LVM for pt to cb to schedule an appt with gil at next available

## 2020-09-27 ENCOUNTER — Ambulatory Visit: Payer: Self-pay | Admitting: Physician Assistant

## 2020-12-30 ENCOUNTER — Other Ambulatory Visit (HOSPITAL_COMMUNITY): Payer: Self-pay

## 2020-12-30 MED ORDER — IBUPROFEN 800 MG PO TABS
800.0000 mg | ORAL_TABLET | Freq: Three times a day (TID) | ORAL | 0 refills | Status: AC
  Filled 2020-12-30: qty 15, 5d supply, fill #0

## 2021-01-07 ENCOUNTER — Other Ambulatory Visit (HOSPITAL_COMMUNITY): Payer: Self-pay

## 2021-08-19 IMAGING — CR DG FOOT COMPLETE 3+V*R*
3 series · 3 of 3 positions shown · non-contrast
Comparison: None.

CLINICAL DATA: Recent trip and fall with foot pain, initial
encounter

EXAM:
RIGHT FOOT COMPLETE - 3+ VIEW

[t foot ap right]
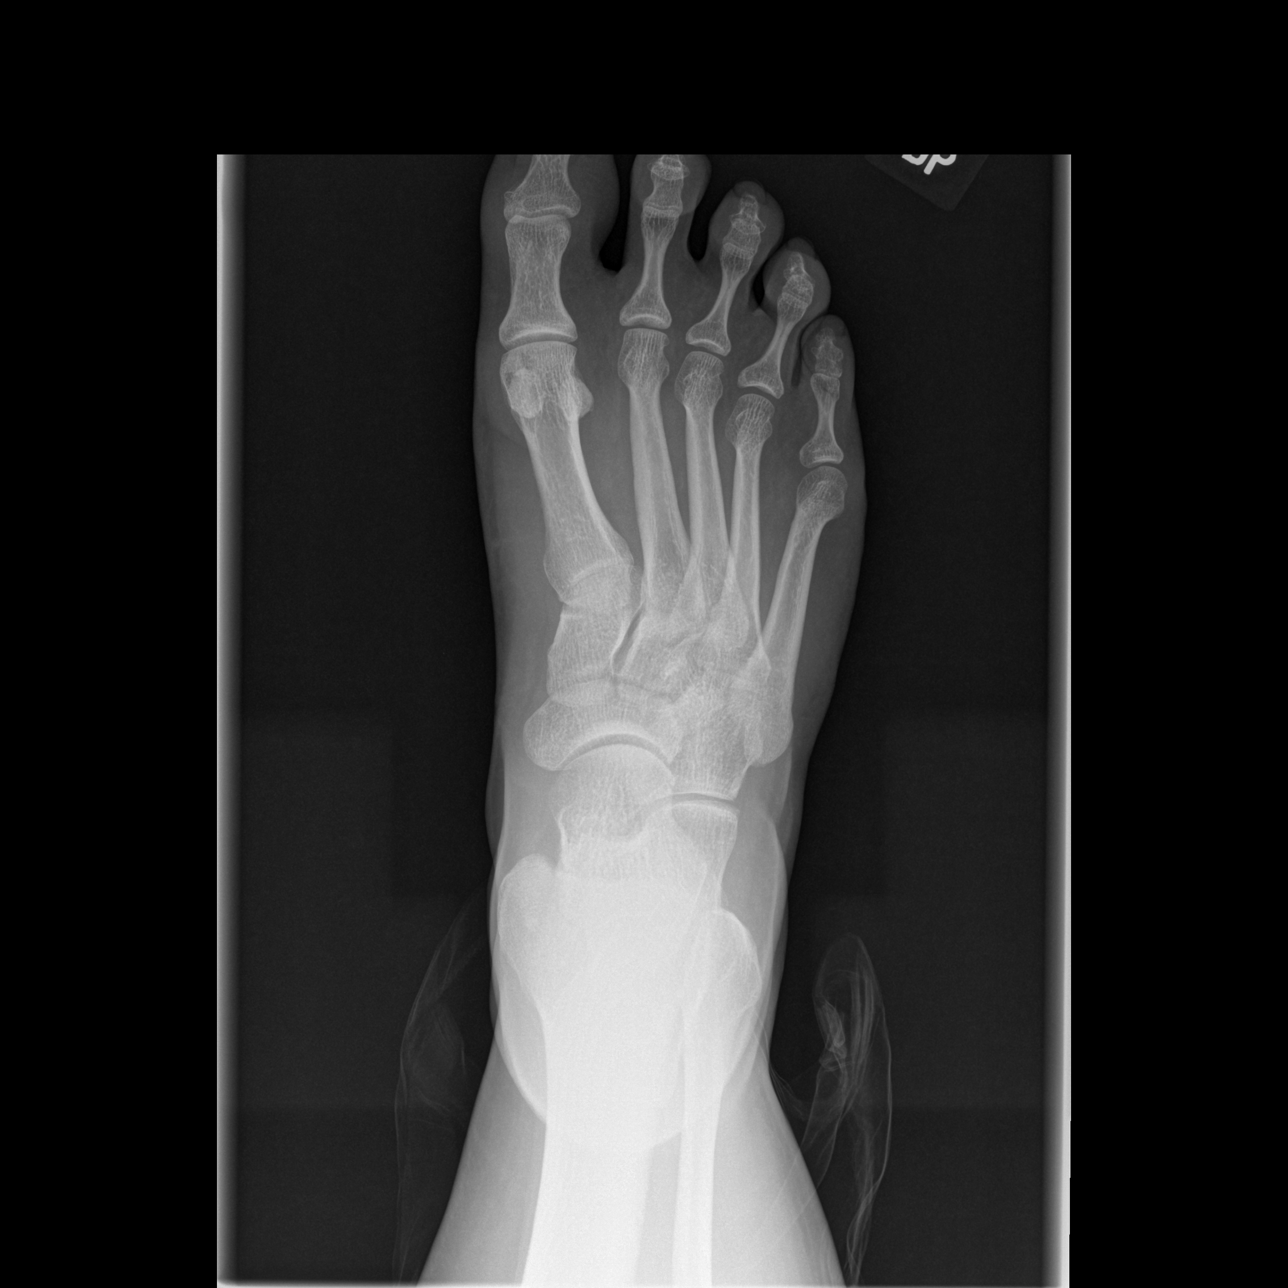

[t foot oblique right]
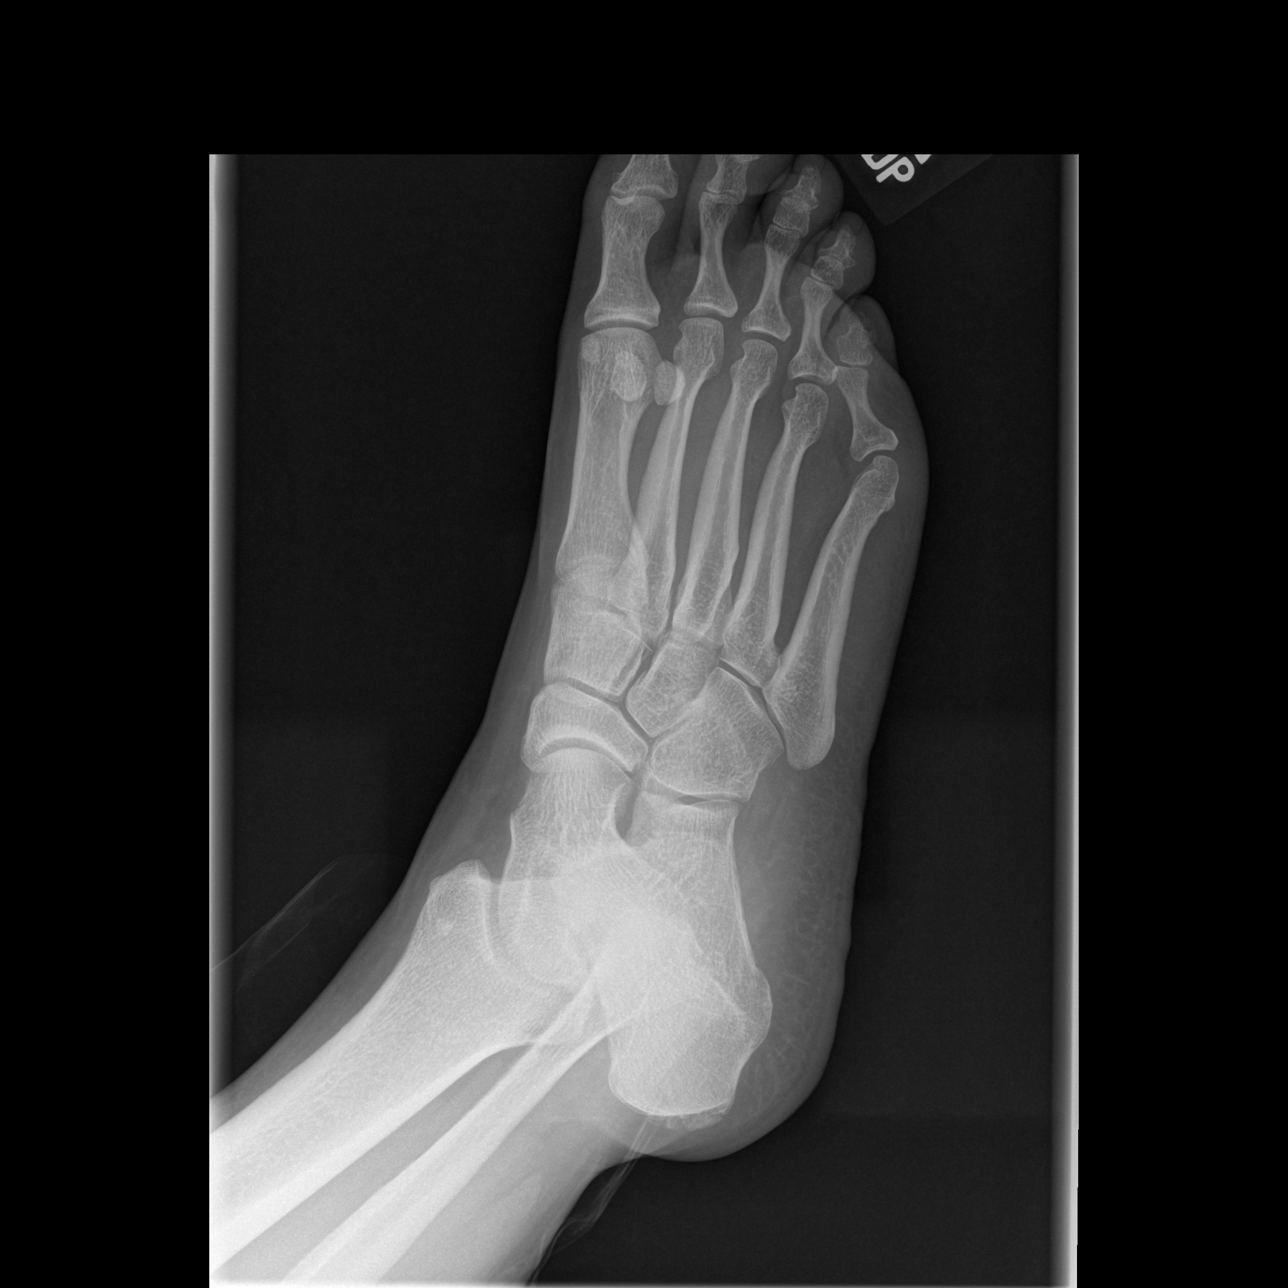

[t foot lat right]
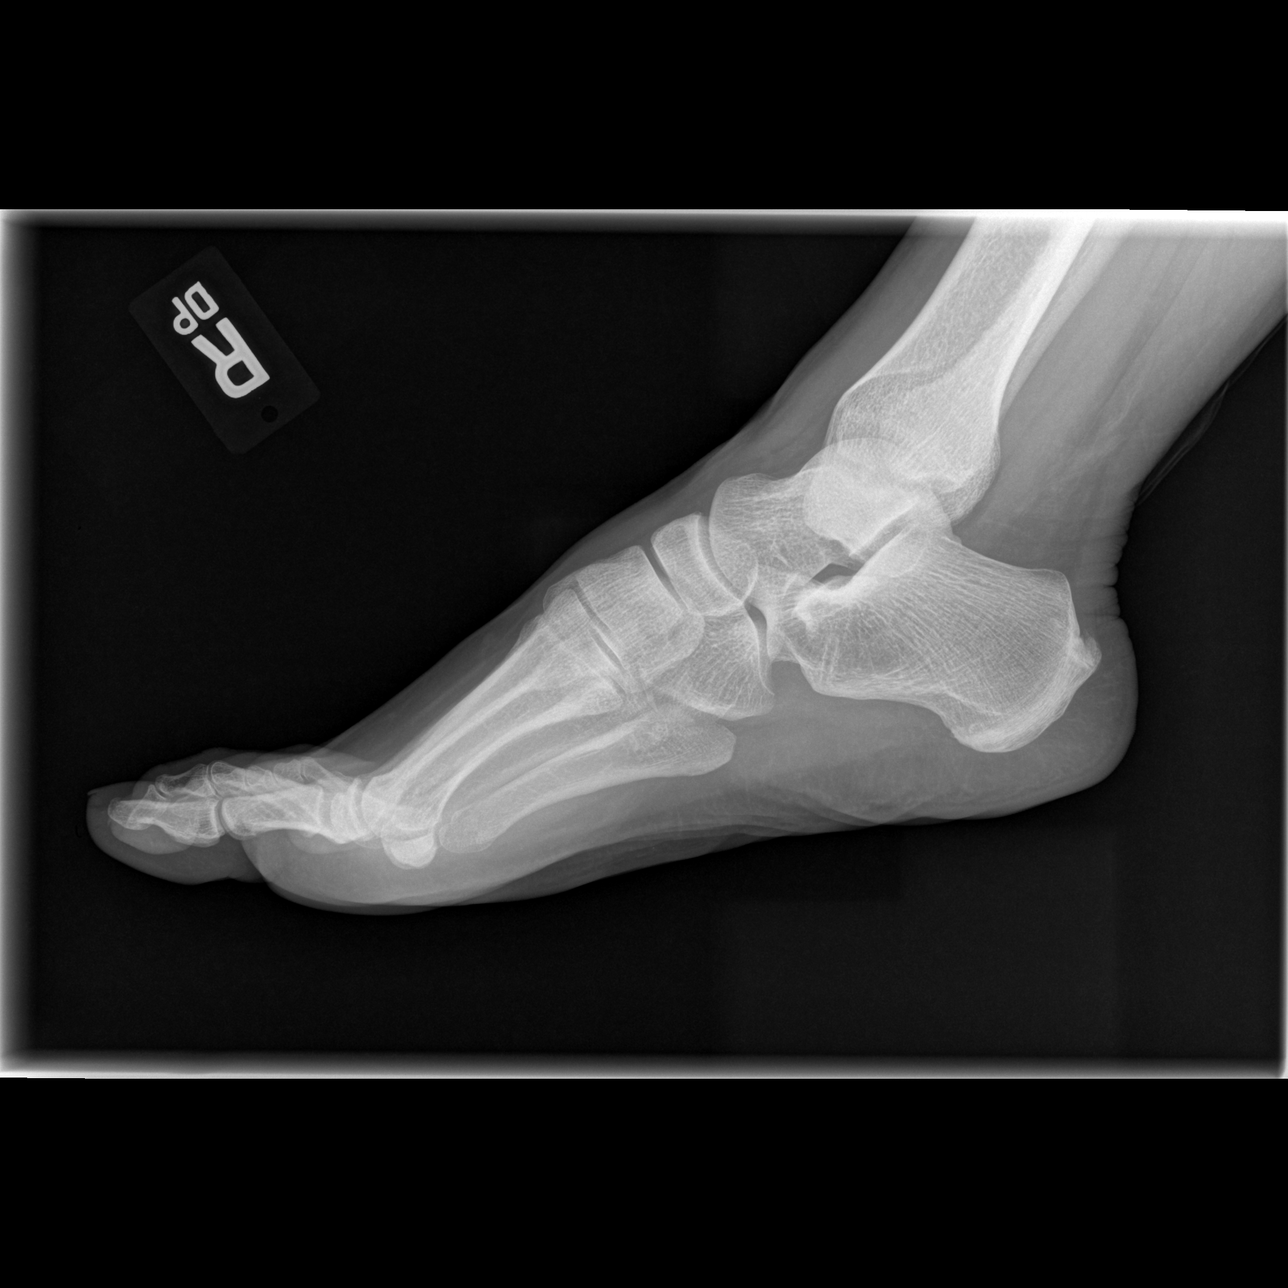

[3 of 3 positions shown; findings below may reference images not displayed]

FINDINGS: There is no evidence of fracture or dislocation. There is no
evidence of arthropathy or other focal bone abnormality. Soft
tissues are unremarkable.
IMPRESSION: No acute abnormality noted.

## 2022-12-29 ENCOUNTER — Other Ambulatory Visit: Payer: Self-pay
